# Patient Record
Sex: Male | Born: 1975 | Race: Black or African American | Hispanic: No | Marital: Single | Smoking: Current every day smoker
Health system: Southern US, Community
[De-identification: ages and names within clinical notes are randomized; demographics above are authoritative.]

## PROBLEM LIST (undated history)

## (undated) DIAGNOSIS — F1010 Alcohol abuse, uncomplicated: Secondary | ICD-10-CM

## (undated) DIAGNOSIS — E785 Hyperlipidemia, unspecified: Secondary | ICD-10-CM

## (undated) DIAGNOSIS — E669 Obesity, unspecified: Secondary | ICD-10-CM

## (undated) DIAGNOSIS — Z720 Tobacco use: Secondary | ICD-10-CM

## (undated) DIAGNOSIS — I428 Other cardiomyopathies: Secondary | ICD-10-CM

## (undated) DIAGNOSIS — G4730 Sleep apnea, unspecified: Secondary | ICD-10-CM

## (undated) DIAGNOSIS — I119 Hypertensive heart disease without heart failure: Secondary | ICD-10-CM

## (undated) DIAGNOSIS — E119 Type 2 diabetes mellitus without complications: Secondary | ICD-10-CM

## (undated) DIAGNOSIS — I5022 Chronic systolic (congestive) heart failure: Secondary | ICD-10-CM

## (undated) HISTORY — DX: Alcohol abuse, uncomplicated: F10.10

## (undated) HISTORY — DX: Hypertensive heart disease without heart failure: I11.9

## (undated) HISTORY — DX: Obesity, unspecified: E66.9

## (undated) HISTORY — DX: Hyperlipidemia, unspecified: E78.5

## (undated) HISTORY — DX: Type 2 diabetes mellitus without complications: E11.9

## (undated) HISTORY — DX: Other cardiomyopathies: I42.8

## (undated) HISTORY — DX: Chronic systolic (congestive) heart failure: I50.22

## (undated) HISTORY — DX: Sleep apnea, unspecified: G47.30

## (undated) HISTORY — DX: Tobacco use: Z72.0

---

## 2008-02-29 ENCOUNTER — Emergency Department: Payer: Self-pay | Admitting: Emergency Medicine

## 2009-08-20 ENCOUNTER — Emergency Department: Payer: Self-pay | Admitting: Emergency Medicine

## 2012-11-17 ENCOUNTER — Emergency Department: Payer: Self-pay | Admitting: Emergency Medicine

## 2012-11-17 LAB — BASIC METABOLIC PANEL
Anion Gap: 4 — ABNORMAL LOW (ref 7–16)
BUN: 10 mg/dL (ref 7–18)
Calcium, Total: 8.8 mg/dL (ref 8.5–10.1)
Co2: 29 mmol/L (ref 21–32)
Creatinine: 0.99 mg/dL (ref 0.60–1.30)
EGFR (Non-African Amer.): >60
Osmolality: 276 (ref 275–301)
Sodium: 137 mmol/L (ref 136–145)

## 2012-11-17 LAB — CBC
HCT: 44.7 % (ref 40.0–52.0)
HGB: 15.5 g/dL (ref 13.0–18.0)
MCH: 31.6 pg (ref 26.0–34.0)
MCHC: 34.7 g/dL (ref 32.0–36.0)
MCV: 91 fL (ref 80–100)
RDW: 13.8 % (ref 11.5–14.5)
WBC: 7.6 10*3/uL (ref 3.8–10.6)

## 2014-06-12 ENCOUNTER — Other Ambulatory Visit (HOSPITAL_COMMUNITY): Payer: Self-pay | Admitting: *Deleted

## 2014-06-12 DIAGNOSIS — I517 Cardiomegaly: Secondary | ICD-10-CM

## 2014-06-22 ENCOUNTER — Other Ambulatory Visit (INDEPENDENT_AMBULATORY_CARE_PROVIDER_SITE_OTHER): Payer: Medicaid Other

## 2014-06-22 ENCOUNTER — Other Ambulatory Visit: Payer: Self-pay

## 2014-06-22 ENCOUNTER — Encounter (INDEPENDENT_AMBULATORY_CARE_PROVIDER_SITE_OTHER): Payer: Self-pay

## 2014-06-22 DIAGNOSIS — I517 Cardiomegaly: Secondary | ICD-10-CM

## 2014-07-12 ENCOUNTER — Ambulatory Visit (INDEPENDENT_AMBULATORY_CARE_PROVIDER_SITE_OTHER): Payer: Medicaid Other | Admitting: Cardiovascular Disease

## 2014-07-12 ENCOUNTER — Encounter: Payer: Self-pay | Admitting: Cardiovascular Disease

## 2014-07-12 VITALS — BP 178/120 | HR 87 | Ht 70.0 in | Wt 267.0 lb

## 2014-07-12 DIAGNOSIS — I5022 Chronic systolic (congestive) heart failure: Secondary | ICD-10-CM

## 2014-07-12 DIAGNOSIS — E785 Hyperlipidemia, unspecified: Secondary | ICD-10-CM

## 2014-07-12 DIAGNOSIS — Z720 Tobacco use: Secondary | ICD-10-CM

## 2014-07-12 DIAGNOSIS — G4730 Sleep apnea, unspecified: Secondary | ICD-10-CM | POA: Insufficient documentation

## 2014-07-12 DIAGNOSIS — I10 Essential (primary) hypertension: Secondary | ICD-10-CM | POA: Insufficient documentation

## 2014-07-12 MED ORDER — CARVEDILOL 12.5 MG PO TABS: 12.5000 mg | ORAL_TABLET | Freq: Two times a day (BID) | ORAL | Status: DC

## 2014-07-12 NOTE — Assessment & Plan Note (Signed)
His lipid profile was reasonable on current dose of lovastatin.

## 2014-07-12 NOTE — Assessment & Plan Note (Signed)
His blood pressure is not controlled. I switched metoprolol to carvedilol for better control and given that he is diabetic. I discussed with him the importance of treating his sleep apnea which is likely contributing to uncontrolled hypertension.

## 2014-07-12 NOTE — Progress Notes (Signed)
Primary care physician: Dr. 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 He has known history of hypertension which has been uncontrolled, type 2 diabetes, obesity, sleep apnea not on CPAP and hyperlipidemia. He complains of exertional dyspnea without chest pain. He reports no orthopnea, PND or lower extremity edema. He underwent a nuclear stress test in October which showed no evidence of perfusion defects. However, ejection fraction was 40%. He had an echocardiogram done in our office which showed severely dilated left ventricle with an ejection fraction of 45-50%, mildly dilated left atrium and mildly dilated aortic root. He smokes one pack per day and drinks tequila and moonshine on weekends. He reports no family history of coronary artery disease or congestive heart failure. Recent labs were reviewed and overall showed unremarkable CBC and basic metabolic profile. Lipid profile showed a cholesterol of 177, HDL of 49, triglyceride of 148 and an LDL of 98.    No Known Allergies   No current outpatient prescriptions on file prior to visit.   No current facility-administered medications on file prior to visit.     Past Medical History  Diagnosis Date  . Essential hypertension   . Tobacco use   . Diabetes mellitus without complication   . Obesity   . Hyperlipidemia   . Sleep apnea      History reviewed. No pertinent past surgical history.   History reviewed. No pertinent family history.   History   Social History  . Marital Status: Single    Spouse Name: N/A    Number of Children: N/A  . Years of Education: N/A   Occupational History  . Not on file.   Social History Main Topics  . Smoking status: Current Every Day Smoker  . Smokeless tobacco: Not on file  . Alcohol Use: 0.0 oz/week    0 Not specified per week     Comment: SOCAILLY  . Drug  Use: No  . Sexual Activity: Not on file   Other Topics Concern  . Not on file   Social History Narrative  . No narrative on file     ROS A 10 point review of system was performed. It is negative other than that mentioned in the history of present illness.   PHYSICAL EXAM   BP 178/120 mmHg  Pulse 87  Ht 5\' 10"  (1.778 m)  Wt 267 lb (121.11 kg)  BMI 38.31 kg/m2 Constitutional: He is oriented to person, place, and time. He appears well-developed and well-nourished. No distress.  HENT: No nasal discharge.  Head: Normocephalic and atraumatic.  Eyes: Pupils are equal and round.  No discharge. Neck: Normal range of motion. Neck supple. No JVD present. No thyromegaly present.  Cardiovascular: Normal rate, regular rhythm, normal heart sounds. Exam reveals no gallop and no friction rub. No murmur heard.  Pulmonary/Chest: Effort normal and breath sounds normal. No stridor. No respiratory distress. He has no wheezes. He has no rales. He exhibits no tenderness.  Abdominal: Soft. Bowel sounds are normal. He exhibits no distension. There is no tenderness. There is no rebound and no guarding.  Musculoskeletal: Normal range of motion. He exhibits no edema and no tenderness.  Neurological: He is alert and oriented to person, place, and time. Coordination normal.  Skin: Skin is warm and dry. No rash noted. He is not diaphoretic. No erythema. No pallor.  Psychiatric: He has a normal mood and affect. His  behavior is normal. Judgment and thought content normal.     EKG: Normal sinus rhythm with left ventricular hypertrophy. Inferolateral ST and T-wave changes.   ASSESSMENT AND PLAN

## 2014-07-12 NOTE — Assessment & Plan Note (Signed)
The patient has chronic systolic heart failure due to nonischemic cardiomyopathy which is likely the result of hypertensive heart disease and uncontrolled hypertension. He is currently [place] Heart Association class II. There is no evidence of fluid overload. I had a prolonged discussion with him about the importance of lifestyle changes and weight loss. I also asked him to cut down or stop alcohol intake altogether. I discussed with him the importance of controlling his blood pressure.

## 2014-07-12 NOTE — Assessment & Plan Note (Signed)
I discussed with him the importance of smoking cessation. 

## 2014-07-12 NOTE — Patient Instructions (Signed)
Your physician has recommended you make the following change in your medication:  Stop Metoprolol  Start Coreg 12.5 mg twice daily    Your physician recommends that you schedule a follow-up appointment in:  3 months with Dr. Kirke Corin

## 2014-10-12 ENCOUNTER — Encounter: Payer: Self-pay | Admitting: Cardiovascular Disease

## 2014-10-12 ENCOUNTER — Ambulatory Visit (INDEPENDENT_AMBULATORY_CARE_PROVIDER_SITE_OTHER): Payer: Medicaid Other | Admitting: Cardiovascular Disease

## 2014-10-12 VITALS — BP 154/102 | HR 62 | Ht 70.0 in | Wt 268.5 lb

## 2014-10-12 DIAGNOSIS — I5022 Chronic systolic (congestive) heart failure: Secondary | ICD-10-CM

## 2014-10-12 DIAGNOSIS — E785 Hyperlipidemia, unspecified: Secondary | ICD-10-CM

## 2014-10-12 DIAGNOSIS — I10 Essential (primary) hypertension: Secondary | ICD-10-CM

## 2014-10-12 DIAGNOSIS — G4730 Sleep apnea, unspecified: Secondary | ICD-10-CM

## 2014-10-12 MED ORDER — AMLODIPINE BESYLATE 5 MG PO TABS: 5.0000 mg | ORAL_TABLET | Freq: Every day | ORAL | Status: DC

## 2014-10-12 NOTE — Assessment & Plan Note (Signed)
The patient is in [place] Heart Association class II. Ejection fraction was mildly reduced likely due to hypertensive heart disease. There is no evidence of fluid overload. Continue blood pressure control.

## 2014-10-12 NOTE — Progress Notes (Signed)
Primary care physician: Dr. 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 He has known history of hypertension which has been uncontrolled, type 2 diabetes, obesity, sleep apnea not on CPAP and hyperlipidemia. He complains of exertional dyspnea without chest pain. He reports no orthopnea, PND or lower extremity edema. He underwent a nuclear stress test in October which showed no evidence of perfusion defects. However, ejection fraction was 40%. He had an echocardiogram done in our office which showed severely dilated left ventricle with an ejection fraction of 45-50%, mildly dilated left atrium and mildly dilated aortic root. He smokes one pack per day and drinks tequila and moonshine on weekends. He reports no family history of coronary artery disease or congestive heart failure. During last visit, I switched metoprolol to carvedilol. Blood pressure improved since then. He is not using the CPAP.   No Known Allergies   Current Outpatient Prescriptions on File Prior to Visit  Medication Sig Dispense Refill  . carvedilol (COREG) 12.5 MG tablet Take 1 tablet (12.5 mg total) by mouth 2 (two) times daily. 60 tablet 6  . lisinopril-hydrochlorothiazide (PRINZIDE,ZESTORETIC) 20-25 MG per tablet Take 1 tablet by mouth daily.    Marland Kitchen lovastatin (MEVACOR) 10 MG tablet Take 10 mg by mouth at bedtime.    . metFORMIN (GLUCOPHAGE) 500 MG tablet Take by mouth 2 (two) times daily with a meal.     No current facility-administered medications on file prior to visit.     Past Medical History  Diagnosis Date  . Essential hypertension   . Tobacco use   . Diabetes mellitus without complication   . Obesity   . Hyperlipidemia   . Sleep apnea      History reviewed. No pertinent past surgical history.   History reviewed. No pertinent family history.   History   Social History   . Marital Status: Single    Spouse Name: N/A  . Number of Children: N/A  . Years of Education: N/A   Occupational History  . Not on file.   Social History Main Topics  . Smoking status: Current Every Day Smoker  . Smokeless tobacco: Not on file  . Alcohol Use: 0.0 oz/week    0 Standard drinks or equivalent per week     Comment: SOCAILLY  . Drug Use: No  . Sexual Activity: Not on file   Other Topics Concern  . Not on file   Social History Narrative     ROS A 10 point review of system was performed. It is negative other than that mentioned in the history of present illness.   PHYSICAL EXAM   BP 154/102 mmHg  Pulse 62  Ht 5\' 10"  (1.778 m)  Wt 268 lb 8 oz (121.791 kg)  BMI 38.53 kg/m2 Constitutional: He is oriented to person, place, and time. He appears well-developed and well-nourished. No distress.  HENT: No nasal discharge.  Head: Normocephalic and atraumatic.  Eyes: Pupils are equal and round.  No discharge. Neck: Normal range of motion. Neck supple. No JVD present. No thyromegaly present.  Cardiovascular: Normal rate, regular rhythm, normal heart sounds. Exam reveals no gallop and no friction rub. No murmur heard.  Pulmonary/Chest: Effort normal and breath sounds normal. No stridor. No respiratory distress. He has no wheezes. He has no rales. He exhibits no tenderness.  Abdominal: Soft. Bowel sounds are normal. He exhibits no distension. There is no  tenderness. There is no rebound and no guarding.  Musculoskeletal: Normal range of motion. He exhibits no edema and no tenderness.  Neurological: He is alert and oriented to person, place, and time. Coordination normal.  Skin: Skin is warm and dry. No rash noted. He is not diaphoretic. No erythema. No pallor.  Psychiatric: He has a normal mood and affect. His behavior is normal. Judgment and thought content normal.       ASSESSMENT AND PLAN

## 2014-10-12 NOTE — Assessment & Plan Note (Signed)
I strongly advised him to use the CPAP machine. Untreated sleep apnea is likely contributing to uncontrolled hypertension and heart failure.

## 2014-10-12 NOTE — Assessment & Plan Note (Signed)
Continue treatment with lovastatin with a target LDL of less than 100.   

## 2014-10-12 NOTE — Assessment & Plan Note (Signed)
Blood pressure improved significantly but still not optimal. Heart rate is somewhat on the low side. Thus, I'm hesitant to increase carvedilol. Thus, I added amlodipine 5 mg once daily. Continue other medications.

## 2014-10-12 NOTE — Patient Instructions (Signed)
Your physician has recommended you make the following change in your medication:  Start Amlodipine 5 mg once daily   Your physician recommends that you schedule a follow-up appointment in:  4 months with Dr. Kirke Corin

## 2015-02-11 ENCOUNTER — Encounter: Payer: Self-pay | Admitting: *Deleted

## 2015-02-11 ENCOUNTER — Ambulatory Visit: Payer: Medicaid Other | Admitting: Cardiovascular Disease

## 2015-02-13 ENCOUNTER — Encounter: Payer: Self-pay | Admitting: *Deleted

## 2015-02-18 NOTE — Telephone Encounter (Signed)
This encounter was created in error - please disregard.

## 2015-08-14 ENCOUNTER — Emergency Department: Payer: Medicaid Other

## 2015-08-14 ENCOUNTER — Other Ambulatory Visit: Payer: Self-pay

## 2015-08-14 ENCOUNTER — Emergency Department
Admission: EM | Admit: 2015-08-14 | Discharge: 2015-08-14 | Disposition: A | Discharge status: Home / Self Care | Payer: Medicaid Other | Attending: Emergency Medicine | Admitting: Emergency Medicine

## 2015-08-14 ENCOUNTER — Encounter: Payer: Self-pay | Admitting: *Deleted

## 2015-08-14 DIAGNOSIS — R0602 Shortness of breath: Secondary | ICD-10-CM | POA: Diagnosis present

## 2015-08-14 DIAGNOSIS — E119 Type 2 diabetes mellitus without complications: Secondary | ICD-10-CM | POA: Diagnosis not present

## 2015-08-14 DIAGNOSIS — Z79899 Other long term (current) drug therapy: Secondary | ICD-10-CM | POA: Diagnosis not present

## 2015-08-14 DIAGNOSIS — F17200 Nicotine dependence, unspecified, uncomplicated: Secondary | ICD-10-CM | POA: Diagnosis not present

## 2015-08-14 DIAGNOSIS — I10 Essential (primary) hypertension: Secondary | ICD-10-CM | POA: Insufficient documentation

## 2015-08-14 DIAGNOSIS — J209 Acute bronchitis, unspecified: Secondary | ICD-10-CM | POA: Diagnosis not present

## 2015-08-14 MED ORDER — METFORMIN HCL 500 MG PO TABS: 500.0000 mg | ORAL_TABLET | Freq: Two times a day (BID) | ORAL | Status: AC

## 2015-08-14 MED ORDER — LISINOPRIL-HYDROCHLOROTHIAZIDE 20-25 MG PO TABS: 1.0000 | ORAL_TABLET | Freq: Every day | ORAL | Status: DC

## 2015-08-14 MED ORDER — CARVEDILOL 12.5 MG PO TABS: 12.5000 mg | ORAL_TABLET | Freq: Two times a day (BID) | ORAL | Status: DC

## 2015-08-14 MED ORDER — PREDNISONE 20 MG PO TABS: 40.0000 mg | ORAL_TABLET | Freq: Every day | ORAL | Status: DC

## 2015-08-14 MED ORDER — ALBUTEROL SULFATE HFA 108 (90 BASE) MCG/ACT IN AERS: 2.0000 | INHALATION_SPRAY | RESPIRATORY_TRACT | Status: AC | PRN

## 2015-08-14 MED ORDER — AMLODIPINE BESYLATE 5 MG PO TABS: 5.0000 mg | ORAL_TABLET | Freq: Every day | ORAL | Status: DC

## 2015-08-14 NOTE — ED Notes (Signed)
Pt reports shortness of breath for the last 3 months, pt reports shortness of breath has increased the last few days

## 2015-08-14 NOTE — ED Provider Notes (Signed)
[facility] Emergency Department Provider Note  ____________________________________________  Time seen: 4:50 PM  I have reviewed the triage vital signs and the nursing notes.   HISTORY  Chief Complaint Shortness of Breath    HPI Jack Davidson is a 39 y.o. male who complains of shortness of breath for the past month. He has a history of sleep apnea and was prescribed a CPAP after a sleep study, but reports he has not been using it recently. The shortness of breath is episodic lasting 30-45 minutes at a time, no aggravating or alleviating factors, not exertional, not pleuritic. No chest pain at all. Has some mild productive cough. Denies underlying lung disease. No fevers, no recent viral illness, no vomiting diarrhea or abdominal pain. Reports that he is out of his heart medicine that he is given, thinks it might be carvedilol.     Past Medical History  Diagnosis Date  . Essential hypertension   . Tobacco use   . Diabetes mellitus without complication (HCC)   . Obesity   . Hyperlipidemia   . Sleep apnea      Patient Active Problem List   Diagnosis Date Noted  . Chronic systolic heart failure (HCC) 07/12/2014  . Essential hypertension   . Tobacco use   . Hyperlipidemia   . Sleep apnea      History reviewed. No pertinent past surgical history.   Current Outpatient Rx  Name  Route  Sig  Dispense  Refill  . albuterol (PROVENTIL HFA) 108 (90 BASE) MCG/ACT inhaler   Inhalation   Inhale 2 puffs into the lungs every 4 (four) hours as needed for wheezing or shortness of breath.   1 Inhaler   0   . amLODipine (NORVASC) 5 MG tablet   Oral   Take 1 tablet (5 mg total) by mouth daily.   30 tablet   6   . carvedilol (COREG) 12.5 MG tablet   Oral   Take 1 tablet (12.5 mg total) by mouth 2 (two) times daily.   60 tablet   6   . lisinopril-hydrochlorothiazide (PRINZIDE,ZESTORETIC) 20-25 MG per tablet   Oral   Take 1 tablet by mouth daily.          Marland Kitchen lovastatin (MEVACOR) 10 MG tablet   Oral   Take 10 mg by mouth at bedtime.         . metFORMIN (GLUCOPHAGE) 500 MG tablet   Oral   Take by mouth 2 (two) times daily with a meal.         . predniSONE (DELTASONE) 20 MG tablet   Oral   Take 2 tablets (40 mg total) by mouth daily.   8 tablet   0      Allergies Review of patient's allergies indicates no known allergies.   No family history on file.  Social History Social History  Substance Use Topics  . Smoking status: Current Every Day Smoker  . Smokeless tobacco: None  . Alcohol Use: 0.0 oz/week    0 Standard drinks or equivalent per week     Comment: SOCAILLY    Review of Systems  Constitutional:   No fever or chills. No weight changes Eyes:   No blurry vision or double vision.  ENT:   Positive sore throat. Cardiovascular:   No chest pain. Respiratory:   Positive dyspnea with cough. Gastrointestinal:   Negative for abdominal pain, vomiting and diarrhea.  No BRBPR or melena. Genitourinary:   Negative for dysuria, urinary retention,  bloody urine, or difficulty urinating. Musculoskeletal:   Negative for back pain. No joint swelling or pain. Skin:   Negative for rash. Neurological:   Negative for headaches, focal weakness or numbness. Psychiatric:  No anxiety or depression.   Endocrine:  No hot/cold intolerance, changes in energy, or sleep difficulty.  10-point ROS otherwise negative.  ____________________________________________   PHYSICAL EXAM:  VITAL SIGNS: ED Triage Vitals  Enc Vitals Group     BP 08/14/15 1620 156/82 mmHg     Pulse Rate 08/14/15 1620 91     Resp 08/14/15 1620 20     Temp 08/14/15 1620 98.1 F (36.7 C)     Temp Source 08/14/15 1620 Oral     SpO2 08/14/15 1620 95 %     Weight 08/14/15 1620 250 lb (113.399 kg)     Height 08/14/15 1620  (1.753 m)     Head Cir --      Peak Flow --      Pain Score --      Pain Loc --      Pain Edu? --      Excl. in GC? --      Vital signs reviewed, nursing assessments reviewed.   Constitutional:   Alert and oriented. Well appearing and in no distress. Eyes:   No scleral icterus. No conjunctival pallor. PERRL. EOMI ENT   Head:   Normocephalic and atraumatic.   Nose:   No congestion/rhinnorhea. No septal hematoma   Mouth/Throat:   MMM, no pharyngeal erythema. No peritonsillar mass. No uvula shift.   Neck:   No stridor. No SubQ emphysema. No meningismus. Hematological/Lymphatic/Immunilogical:   No cervical lymphadenopathy. Cardiovascular:   RRR. Normal and symmetric distal pulses are present in all extremities. No murmurs, rubs, or gallops. Respiratory:   Normal respiratory effort without tachypnea nor retractions. Breath sounds are clear and equal bilaterally. No wheezes/rales/rhonchi except for minimal crackles in the deep left base. Gastrointestinal:   Soft and nontender. No distention. There is no CVA tenderness.  No rebound, rigidity, or guarding. Genitourinary:   deferred Musculoskeletal:   Nontender with normal range of motion in all extremities. No joint effusions.  No lower extremity tenderness.  No edema. Neurologic:   Normal speech and language.  CN 2-10 normal. Motor grossly intact. No pronator drift.  Normal gait. No gross focal neurologic deficits are appreciated.  Skin:    Skin is warm, dry and intact. No rash noted.  No petechiae, purpura, or bullae. Psychiatric:   Mood and affect are normal. Speech and behavior are normal. Patient exhibits appropriate insight and judgment.  ____________________________________________    LABS (pertinent positives/negatives) (all labs ordered are listed, but only abnormal results are displayed) Labs Reviewed - No data to display ____________________________________________   EKG  Interpreted by me Sinus rhythm rate of 94, left axis, normal intervals. Normal QRS and ST segments. LVH with 3 pull abnormality in the lateral leads and 1  PVC.  ____________________________________________    RADIOLOGY  Chest x-ray shows some peribronchial cuffing consistent with acute bronchitis  ____________________________________________   PROCEDURES   ____________________________________________   INITIAL IMPRESSION / ASSESSMENT AND PLAN / ED COURSE  Pertinent labs & imaging results that were available during my care of the patient were reviewed by me and considered in my medical decision making (see chart for details).  Patient presents with chronic episodic shortness of breath. Has not seen his doctor for this yet. Most likely related to CPAP noncompliance. Chest x-ray also suggests bronchitis,  also will start a trial of steroids and albuterol. Also refill his medications to ensure that he is managing his hypertension CAD and diabetes. Social is well-appearing no acute distress with reassuring vital signs and exam.     ____________________________________________   FINAL CLINICAL IMPRESSION(S) / ED DIAGNOSES  Final diagnoses:  Acute bronchitis, unspecified organism      Sharman Cheek, MD 08/14/15 1719

## 2015-08-14 NOTE — ED Notes (Signed)
C/o some sob cough past month

## 2015-08-14 NOTE — Discharge Instructions (Signed)

## 2015-12-09 ENCOUNTER — Other Ambulatory Visit: Payer: Self-pay | Admitting: Internal Medicine

## 2015-12-09 DIAGNOSIS — R0602 Shortness of breath: Secondary | ICD-10-CM

## 2015-12-12 ENCOUNTER — Ambulatory Visit: Payer: Medicaid Other | Attending: Internal Medicine

## 2015-12-26 ENCOUNTER — Ambulatory Visit: Payer: Medicaid Other | Attending: Internal Medicine

## 2016-01-14 ENCOUNTER — Inpatient Hospital Stay
Admission: EM | Admit: 2016-01-14 | Discharge: 2016-01-15 | DRG: 293 | Disposition: A | Discharge status: Home / Self Care | Payer: Medicaid Other | Attending: Internal Medicine | Admitting: Internal Medicine

## 2016-01-14 ENCOUNTER — Encounter: Payer: Self-pay | Admitting: Emergency Medicine

## 2016-01-14 ENCOUNTER — Emergency Department: Payer: Medicaid Other

## 2016-01-14 ENCOUNTER — Inpatient Hospital Stay
Admit: 2016-01-14 | Discharge: 2016-01-14 | Disposition: A | Discharge status: Home / Self Care | Payer: Medicaid Other | Attending: Internal Medicine | Admitting: Internal Medicine

## 2016-01-14 DIAGNOSIS — F17290 Nicotine dependence, other tobacco product, uncomplicated: Secondary | ICD-10-CM | POA: Diagnosis present

## 2016-01-14 DIAGNOSIS — E876 Hypokalemia: Secondary | ICD-10-CM | POA: Diagnosis present

## 2016-01-14 DIAGNOSIS — Z7952 Long term (current) use of systemic steroids: Secondary | ICD-10-CM

## 2016-01-14 DIAGNOSIS — E785 Hyperlipidemia, unspecified: Secondary | ICD-10-CM | POA: Diagnosis present

## 2016-01-14 DIAGNOSIS — I5023 Acute on chronic systolic (congestive) heart failure: Secondary | ICD-10-CM | POA: Diagnosis present

## 2016-01-14 DIAGNOSIS — Z6835 Body mass index (BMI) 35.0-35.9, adult: Secondary | ICD-10-CM

## 2016-01-14 DIAGNOSIS — Z79899 Other long term (current) drug therapy: Secondary | ICD-10-CM | POA: Diagnosis not present

## 2016-01-14 DIAGNOSIS — R0602 Shortness of breath: Secondary | ICD-10-CM | POA: Diagnosis present

## 2016-01-14 DIAGNOSIS — I5021 Acute systolic (congestive) heart failure: Secondary | ICD-10-CM

## 2016-01-14 DIAGNOSIS — E669 Obesity, unspecified: Secondary | ICD-10-CM | POA: Diagnosis present

## 2016-01-14 DIAGNOSIS — Z833 Family history of diabetes mellitus: Secondary | ICD-10-CM | POA: Diagnosis not present

## 2016-01-14 DIAGNOSIS — E119 Type 2 diabetes mellitus without complications: Secondary | ICD-10-CM | POA: Diagnosis present

## 2016-01-14 DIAGNOSIS — I110 Hypertensive heart disease with heart failure: Secondary | ICD-10-CM | POA: Diagnosis present

## 2016-01-14 DIAGNOSIS — I509 Heart failure, unspecified: Secondary | ICD-10-CM

## 2016-01-14 DIAGNOSIS — Z7984 Long term (current) use of oral hypoglycemic drugs: Secondary | ICD-10-CM

## 2016-01-14 LAB — CBC
HEMATOCRIT: 42.1 % (ref 40.0–52.0)
Hemoglobin: 14.3 g/dL (ref 13.0–18.0)
MCH: 32.1 pg (ref 26.0–34.0)
MCHC: 34.1 g/dL (ref 32.0–36.0)
MCV: 94.2 fL (ref 80.0–100.0)
PLATELETS: 182 10*3/uL (ref 150–440)
RBC: 4.47 MIL/uL (ref 4.40–5.90)
RDW: 15.3 % — AB (ref 11.5–14.5)
WBC: 7.2 10*3/uL (ref 3.8–10.6)

## 2016-01-14 LAB — GLUCOSE, CAPILLARY
GLUCOSE-CAPILLARY: 234 mg/dL — AB (ref 65–99)
Glucose-Capillary: 234 mg/dL — ABNORMAL HIGH (ref 65–99)

## 2016-01-14 LAB — COMPREHENSIVE METABOLIC PANEL
ALT: 255 U/L — ABNORMAL HIGH (ref 17–63)
ANION GAP: 8 (ref 5–15)
AST: 145 U/L — ABNORMAL HIGH (ref 15–41)
Albumin: 4.4 g/dL (ref 3.5–5.0)
Alkaline Phosphatase: 57 U/L (ref 38–126)
BUN: 12 mg/dL (ref 6–20)
CHLORIDE: 105 mmol/L (ref 101–111)
CO2: 26 mmol/L (ref 22–32)
Calcium: 9.5 mg/dL (ref 8.9–10.3)
Creatinine, Ser: 0.97 mg/dL (ref 0.61–1.24)
GFR calc Af Amer: >60 mL/min (ref >60)
GFR calc non Af Amer: >60 mL/min (ref >60)
Glucose, Bld: 132 mg/dL — ABNORMAL HIGH (ref 65–99)
POTASSIUM: 4 mmol/L (ref 3.5–5.1)
Sodium: 139 mmol/L (ref 135–145)
Total Bilirubin: 0.7 mg/dL (ref 0.3–1.2)
Total Protein: 7.3 g/dL (ref 6.5–8.1)

## 2016-01-14 LAB — BRAIN NATRIURETIC PEPTIDE: B NATRIURETIC PEPTIDE 5: 428 pg/mL — AB (ref 0.0–100.0)

## 2016-01-14 LAB — TROPONIN I

## 2016-01-14 MED ORDER — HEPARIN SODIUM (PORCINE) 5000 UNIT/ML IJ SOLN
5000.0000 [IU] | Freq: Three times a day (TID) | INTRAMUSCULAR | Status: DC
Start: 2016-01-14 — End: 2016-01-15
  Administered 2016-01-14 – 2016-01-15 (×2): 5000 [IU] via SUBCUTANEOUS
  Filled 2016-01-14 (×2): qty 1

## 2016-01-14 MED ORDER — ACETAMINOPHEN 325 MG PO TABS
ORAL_TABLET | ORAL | Status: AC
  Administered 2016-01-14: 650 mg
  Filled 2016-01-14: qty 2

## 2016-01-14 MED ORDER — AMLODIPINE BESYLATE 5 MG PO TABS
5.0000 mg | ORAL_TABLET | Freq: Every day | ORAL | Status: DC
  Administered 2016-01-14 – 2016-01-15 (×2): 5 mg via ORAL
  Filled 2016-01-14 (×3): qty 1

## 2016-01-14 MED ORDER — CARVEDILOL 12.5 MG PO TABS
12.5000 mg | ORAL_TABLET | Freq: Two times a day (BID) | ORAL | Status: DC
  Administered 2016-01-14 – 2016-01-15 (×2): 12.5 mg via ORAL
  Filled 2016-01-14 (×2): qty 1

## 2016-01-14 MED ORDER — METHYLPREDNISOLONE SODIUM SUCC 125 MG IJ SOLR
125.0000 mg | Freq: Once | INTRAMUSCULAR | Status: AC
  Administered 2016-01-14: 125 mg via INTRAVENOUS
  Filled 2016-01-14: qty 2

## 2016-01-14 MED ORDER — SODIUM CHLORIDE 0.9% FLUSH
3.0000 mL | Freq: Two times a day (BID) | INTRAVENOUS | Status: DC
  Administered 2016-01-14 – 2016-01-15 (×2): 3 mL via INTRAVENOUS

## 2016-01-14 MED ORDER — METFORMIN HCL 500 MG PO TABS
500.0000 mg | ORAL_TABLET | Freq: Two times a day (BID) | ORAL | Status: DC
  Administered 2016-01-15: 500 mg via ORAL
  Filled 2016-01-14: qty 1

## 2016-01-14 MED ORDER — IBUPROFEN 400 MG PO TABS
400.0000 mg | ORAL_TABLET | ORAL | Status: AC
  Administered 2016-01-14: 400 mg via ORAL
  Filled 2016-01-14: qty 1

## 2016-01-14 MED ORDER — ALBUTEROL SULFATE (2.5 MG/3ML) 0.083% IN NEBU
2.5000 mg | INHALATION_SOLUTION | Freq: Once | RESPIRATORY_TRACT | Status: AC
  Administered 2016-01-14: 2.5 mg via RESPIRATORY_TRACT
  Filled 2016-01-14: qty 3

## 2016-01-14 MED ORDER — FUROSEMIDE 10 MG/ML IJ SOLN
20.0000 mg | Freq: Two times a day (BID) | INTRAMUSCULAR | Status: DC
  Administered 2016-01-14 – 2016-01-15 (×2): 20 mg via INTRAVENOUS
  Filled 2016-01-14 (×2): qty 2

## 2016-01-14 MED ORDER — ACETAMINOPHEN 325 MG PO TABS: 650.0000 mg | ORAL_TABLET | Freq: Once | ORAL | Status: DC

## 2016-01-14 MED ORDER — PRAVASTATIN SODIUM 10 MG PO TABS
10.0000 mg | ORAL_TABLET | Freq: Every day | ORAL | Status: DC
  Administered 2016-01-14: 10 mg via ORAL
  Filled 2016-01-14: qty 1

## 2016-01-14 MED ORDER — INSULIN ASPART 100 UNIT/ML ~~LOC~~ SOLN
0.0000 [IU] | Freq: Three times a day (TID) | SUBCUTANEOUS | Status: DC
  Administered 2016-01-15: 2 [IU] via SUBCUTANEOUS
  Filled 2016-01-14: qty 2

## 2016-01-14 MED ORDER — IPRATROPIUM-ALBUTEROL 0.5-2.5 (3) MG/3ML IN SOLN
3.0000 mL | Freq: Once | RESPIRATORY_TRACT | Status: AC
  Administered 2016-01-14: 3 mL via RESPIRATORY_TRACT
  Filled 2016-01-14: qty 3

## 2016-01-14 MED ORDER — ALBUTEROL SULFATE HFA 108 (90 BASE) MCG/ACT IN AERS: 2.0000 | INHALATION_SPRAY | RESPIRATORY_TRACT | Status: DC | PRN

## 2016-01-14 MED ORDER — NICOTINE 21 MG/24HR TD PT24
21.0000 mg | MEDICATED_PATCH | Freq: Every day | TRANSDERMAL | Status: DC
  Administered 2016-01-14 – 2016-01-15 (×2): 21 mg via TRANSDERMAL
  Filled 2016-01-14 (×2): qty 1

## 2016-01-14 MED ORDER — NITROGLYCERIN 2 % TD OINT
1.0000 [in_us] | TOPICAL_OINTMENT | Freq: Four times a day (QID) | TRANSDERMAL | Status: DC
  Administered 2016-01-14: 1 [in_us] via TOPICAL
  Filled 2016-01-14: qty 1

## 2016-01-14 MED ORDER — FUROSEMIDE 10 MG/ML IJ SOLN
40.0000 mg | Freq: Once | INTRAMUSCULAR | Status: AC
  Administered 2016-01-14: 40 mg via INTRAVENOUS
  Filled 2016-01-14: qty 4

## 2016-01-14 MED ORDER — ALBUTEROL SULFATE (2.5 MG/3ML) 0.083% IN NEBU: 2.5000 mg | INHALATION_SOLUTION | RESPIRATORY_TRACT | Status: DC | PRN

## 2016-01-14 NOTE — ED Notes (Signed)
Patient presents to the ED stating, "I need air, I can't catch my breath."  Patient prefers standing to sitting.  Patient appears pale and skin is clammy.  Patient states he generally has shortness of breath in the evening but patient states last night it was so bad he couldn't ever get to sleep.  Patient reports taking several medications for hypertension and diabetes.  Patient denies known history of COPD, asthma, or CHF.  Patient denies chest pain at this time.  Patient is obviously tachypnic.

## 2016-01-14 NOTE — ED Notes (Signed)
Pt placed on [place] after nebulizer treatment and encouraged to breathe through nose. Pt calming down at this time and on the stretcher.

## 2016-01-14 NOTE — Progress Notes (Signed)
A  &O .Reported HA and received meds. Takes meds ok. NSR. Room air. Pt has no further concerns at this time.

## 2016-01-14 NOTE — ED Notes (Signed)
Patient appears to be uncomfortable while on nasal cannula, unable to sit still even sitting straight up, patient prefers to stand and feels he cannot breathe well.  Patient placed on non-re breather at this time.  MD aware.

## 2016-01-14 NOTE — ED Provider Notes (Signed)
[facility] Emergency Department Provider Note  ____________________________________________    I have reviewed the triage vital signs and the nursing notes.   HISTORY  Chief Complaint Shortness of Breath    HPI Jack Davidson is a 40 y.o. male who presents with shortness of breath. He reports worsening shortness of breath over the last day. Today it is significantly worse and he feels like he cannot catch his breath. He does smoke a pack of cigars a day but denies a history of COPD, no history of CHF. He denies chest pain. No fevers or chills. No recent travel. No calf pain or swelling, but he does report lower extremity swelling intermittently over the last week.Does have a history of high blood pressure that is poorly controlled. He does have a history of diabetes. No pleurisy     Past Medical History  Diagnosis Date  . Essential hypertension   . Tobacco use   . Diabetes mellitus without complication (HCC)   . Obesity   . Hyperlipidemia   . Sleep apnea     Patient Active Problem List   Diagnosis Date Noted  . Chronic systolic heart failure (HCC) 07/12/2014  . Essential hypertension   . Tobacco use   . Hyperlipidemia   . Sleep apnea     History reviewed. No pertinent past surgical history.  Current Outpatient Rx  Name  Route  Sig  Dispense  Refill  . amLODipine (NORVASC) 5 MG tablet   Oral   Take 1 tablet (5 mg total) by mouth daily. Patient taking differently: Take 10 mg by mouth daily.    30 tablet   1   . carvedilol (COREG) 12.5 MG tablet   Oral   Take 1 tablet (12.5 mg total) by mouth 2 (two) times daily. Patient taking differently: Take 25 mg by mouth daily.    60 tablet   1   . metFORMIN (GLUCOPHAGE) 500 MG tablet   Oral   Take 1 tablet (500 mg total) by mouth 2 (two) times daily with a meal.   60 tablet   1   . albuterol (PROVENTIL HFA) 108 (90 BASE) MCG/ACT inhaler   Inhalation   Inhale 2 puffs into the lungs every 4  (four) hours as needed for wheezing or shortness of breath.   1 Inhaler   0   . lovastatin (MEVACOR) 10 MG tablet   Oral   Take 10 mg by mouth at bedtime.         . predniSONE (DELTASONE) 20 MG tablet   Oral   Take 2 tablets (40 mg total) by mouth daily.   8 tablet   0     Allergies Review of patient's allergies indicates no known allergies.  No family history on file.  Social History Social History  Substance Use Topics  . Smoking status: Current Every Day Smoker  . Smokeless tobacco: None  . Alcohol Use: 0.0 oz/week    0 Standard drinks or equivalent per week     Comment: SOCAILLY    Review of Systems  Constitutional: Negative for fever. Eyes: Negative for redness ENT: Negative for sore throat Cardiovascular: Negative for chest pain Respiratory: As above Gastrointestinal: Negative for abdominal pain Genitourinary: Negative for dysuria. Musculoskeletal: Negative for back pain. Skin: Negative for rash. Neurological: Negative for focal weakness Psychiatric: Mild anxiety    ____________________________________________   PHYSICAL EXAM:  VITAL SIGNS: ED Triage Vitals  Enc Vitals Group     BP 01/14/16 1100 164/108  mmHg     Pulse Rate 01/14/16 1100 86     Resp 01/14/16 1100 32     Temp 01/14/16 1100 98.3 F (36.8 C)     Temp Source 01/14/16 1100 Oral     SpO2 01/14/16 1100 86 %     Weight 01/14/16 1100 245 lb (111.131 kg)     Height 01/14/16 1100  (1.753 m)     Head Cir --      Peak Flow --      Pain Score --      Pain Loc --      Pain Edu? --      Excl. in GC? --     Constitutional: Alert and oriented. Noticeably short of breath Eyes: Conjunctivae are normal. No erythema or injection ENT   Head: Normocephalic and atraumatic.   Mouth/Throat: Mucous membranes are moist. Cardiovascular: Normal rate, regular rhythm. Normal and symmetric distal pulses are present in the upper extremities. No murmurs or rubs  Respiratory: Increased  respiratory effort with tachypnea. Scattered wheezes and bibasilar rales Gastrointestinal: Soft and non-tender in all quadrants. No distention. There is no CVA tenderness. Genitourinary: deferred Musculoskeletal: Nontender with normal range of motion in all extremities.  Neurologic:  Normal speech and language. No gross focal neurologic deficits are appreciated. Skin:  Skin is warm, dry and intact. No rash noted. Psychiatric: Mood and affect are normal. Patient exhibits appropriate insight and judgment.  ____________________________________________    LABS (pertinent positives/negatives)  Labs Reviewed  CBC - Abnormal; Notable for the following:    RDW 15.3 (*)    All other components within normal limits  COMPREHENSIVE METABOLIC PANEL - Abnormal; Notable for the following:    Glucose, Bld 132 (*)    AST 145 (*)    ALT 255 (*)    All other components within normal limits  BRAIN NATRIURETIC PEPTIDE - Abnormal; Notable for the following:    B Natriuretic Peptide 428.0 (*)    All other components within normal limits  TROPONIN I    ____________________________________________   EKG  ED ECG REPORT I, Jene Every, the attending physician, personally viewed and interpreted this ECG.  Date: 01/14/2016 EKG Time: 11:05 AM Rate: 88 Rhythm: normal sinus rhythm QRS Axis: normal Intervals: Prolonged QT ST/T Wave abnormalities: normal  e   ____________________________________________    RADIOLOGY  Chest x-ray shows pulmonary vascular congestion   ____________________________________________   PROCEDURES  Procedure(s) performed: none  Critical Care performed: none  ____________________________________________   INITIAL IMPRESSION / ASSESSMENT AND PLAN / ED COURSE  Pertinent labs & imaging results that were available during my care of the patient were reviewed by me and considered in my medical decision making (see chart for details).  Patient presents with  shortness of breath, he is hypoxic. Differential includes COPD, less likely CHF, we will treat with 2 nebs and steroids provided oxygen and reevaluate after labs and x-rays are returned.  Chest x-ray is more consistent with pulmonary vascular congestion, suggest CHF. Lasix 40 mg ordered and Nitropaste applied  Patient will require admission for further management  ____________________________________________   FINAL CLINICAL IMPRESSION(S) / ED DIAGNOSES  Final diagnoses:  Acute congestive heart failure, unspecified congestive heart failure type (HCC)          Jene Every, MD 01/14/16 1326

## 2016-01-14 NOTE — Progress Notes (Signed)
Pt instructed to not take outside medications. Pt was on the phone stating "bring me my BC powder". Pt was told hospital could provide him with all his meds.

## 2016-01-14 NOTE — ED Notes (Signed)
Patient placed on 4L [place] at this time.  Oxygen saturation is 90%.

## 2016-01-14 NOTE — H&P (Signed)
[facility] - [place] at [facility]   PATIENT NAME: Jack Davidson    MR#:  [id number]  DATE OF BIRTH:  April 07, 1976  DATE OF ADMISSION:  01/14/2016  PRIMARY CARE PHYSICIAN: Sherrie Mustache, MD   REQUESTING/REFERRING PHYSICIAN: Kinner  CHIEF COMPLAINT:   Chief Complaint  Patient presents with  . Shortness of Breath    HISTORY OF PRESENT ILLNESS: Jack Davidson  is a 40 y.o. male with a known history of Essential hypertension, active smoking, diabetes, obesity, hyperlipidemia, CHF- ejection fraction 40% as per the previous record, for last few weeks is progressively getting more and more short of breath. He says his shortness of breath comes while he is doing some work and also while he tried to sleep flat at night time he was little bit better sitting up in the bed. He also noted some swelling on his both ankles, and progressively overall condition getting worse for last 3-4 days so he decided to come to emergency room today. On chest x-ray was noted to have pulmonary edema and so given to hospitalist team for admission.  PAST MEDICAL HISTORY:   Past Medical History  Diagnosis Date  . Essential hypertension   . Tobacco use   . Diabetes mellitus without complication (HCC)   . Obesity   . Hyperlipidemia   . Sleep apnea   . CHF (congestive heart failure) (HCC)     PAST SURGICAL HISTORY: History reviewed. No pertinent past surgical history.  SOCIAL HISTORY:  Social History  Substance Use Topics  . Smoking status: Current Every Day Smoker -- 0.50 packs/day  . Smokeless tobacco: Not on file  . Alcohol Use: 0.0 oz/week    0 Standard drinks or equivalent per week     Comment: SOCAILLY    FAMILY HISTORY:  Family History  Problem Relation Age of Onset  . Diabetes Mother     DRUG ALLERGIES: No Known Allergies  REVIEW OF SYSTEMS:   CONSTITUTIONAL: No fever, fatigue or weakness.  EYES: No blurred or double vision.  EARS, NOSE, AND THROAT: No tinnitus or ear  pain.  RESPIRATORY: No cough, Positive for shortness of breath, no wheezing or hemoptysis.  CARDIOVASCULAR: No chest pain, orthopnea, edema.  GASTROINTESTINAL: No nausea, vomiting, diarrhea or abdominal pain.  GENITOURINARY: No dysuria,Hematuria.  ENDOCRINE: No polyuria, nocturia,  HEMATOLOGY: No anemia, easy bruising or bleeding SKIN: No rash or lesion. MUSCULOSKELETAL: No joint pain or arthritis.   NEUROLOGIC: No tingling, numbness, weakness.  PSYCHIATRY: No anxiety or depression.   MEDICATIONS AT HOME:  Prior to Admission medications   Medication Sig Start Date End Date Taking? Authorizing Provider  amLODipine (NORVASC) 5 MG tablet Take 1 tablet (5 mg total) by mouth daily. Patient taking differently: Take 10 mg by mouth daily.  08/14/15  Yes Sharman Cheek, MD  carvedilol (COREG) 12.5 MG tablet Take 1 tablet (12.5 mg total) by mouth 2 (two) times daily. Patient taking differently: Take 25 mg by mouth daily.  08/14/15  Yes Sharman Cheek, MD  metFORMIN (GLUCOPHAGE) 500 MG tablet Take 1 tablet (500 mg total) by mouth 2 (two) times daily with a meal. 08/14/15  Yes Sharman Cheek, MD  albuterol (PROVENTIL HFA) 108 (90 BASE) MCG/ACT inhaler Inhale 2 puffs into the lungs every 4 (four) hours as needed for wheezing or shortness of breath. 08/14/15   Sharman Cheek, MD  lovastatin (MEVACOR) 10 MG tablet Take 10 mg by mouth at bedtime.    Historical Provider, MD  predniSONE (DELTASONE) 20 MG tablet  Take 2 tablets (40 mg total) by mouth daily. 08/14/15   Sharman Cheek, MD      PHYSICAL EXAMINATION:   VITAL SIGNS: Blood pressure 148/128, pulse 83, temperature 98.3 F (36.8 C), temperature source Oral, resp. rate 19, height 5\' 9"  (1.753 m), weight 111.131 kg (245 lb), SpO2 98 %.  GENERAL:  40 y.o.-year-old patient lying in the bed with no acute distress.  EYES: Pupils equal, round, reactive to light and accommodation. No scleral icterus. Extraocular muscles intact.  HEENT: Head  atraumatic, normocephalic. Oropharynx and nasopharynx clear.  NECK:  Supple, no jugular venous distention. No thyroid enlargement, no tenderness.  LUNGS: Normal breath sounds bilaterally, no wheezing,Some crepitation. No use of accessory muscles of respiration.  CARDIOVASCULAR: S1, S2 normal. No murmurs, rubs, or gallops.  ABDOMEN: Soft, nontender, nondistended. Bowel sounds present. No organomegaly or mass.  EXTREMITIES: Mild bilateral pedal edema, no cyanosis, or clubbing.  NEUROLOGIC: Cranial nerves II through XII are intact. Muscle strength 5/5 in all extremities. Sensation intact. Gait not checked.  PSYCHIATRIC: The patient is alert and oriented x 3.  SKIN: No obvious rash, lesion, or ulcer.   LABORATORY PANEL:   CBC  Recent Labs Lab 01/14/16 1139  WBC 7.2  HGB 14.3  HCT 42.1  PLT 182  MCV 94.2  MCH 32.1  MCHC 34.1  RDW 15.3*   ------------------------------------------------------------------------------------------------------------------  Chemistries   Recent Labs Lab 01/14/16 1139  NA 139  K 4.0  CL 105  CO2 26  GLUCOSE 132*  BUN 12  CREATININE 0.97  CALCIUM 9.5  AST 145*  ALT 255*  ALKPHOS 57  BILITOT 0.7   ------------------------------------------------------------------------------------------------------------------ estimated creatinine clearance is 123.2 mL/min (by C-G formula based on Cr of 0.97). ------------------------------------------------------------------------------------------------------------------ No results for input(s): TSH, T4TOTAL, T3FREE, THYROIDAB in the last 72 hours.  Invalid input(s): FREET3   Coagulation profile No results for input(s): INR, PROTIME in the last 168 hours. ------------------------------------------------------------------------------------------------------------------- No results for input(s): DDIMER in the last 72  hours. -------------------------------------------------------------------------------------------------------------------  Cardiac Enzymes  Recent Labs Lab 01/14/16 1139  TROPONINI <0.03   ------------------------------------------------------------------------------------------------------------------ Invalid input(s): POCBNP  ---------------------------------------------------------------------------------------------------------------  Urinalysis No results found for: COLORURINE, APPEARANCEUR, LABSPEC, PHURINE, GLUCOSEU, HGBUR, BILIRUBINUR, KETONESUR, PROTEINUR, UROBILINOGEN, NITRITE, LEUKOCYTESUR   RADIOLOGY: Dg Chest Portable 1 View  01/14/2016  CLINICAL DATA:  Patient states he needs air, unable to catch his breath, shortness of breath in the evenings generally but increased last night, unable to sleep, hypertension, diabetes mellitus, smoker EXAM: PORTABLE CHEST 1 VIEW COMPARISON:  Portable exam 1121 hours compared to 08/14/2015 FINDINGS: Enlargement of cardiac silhouette with pulmonary vascular congestion. Mild elongation of thoracic aorta. Slight chronic accentuation of perihilar markings stable. Mild RIGHT basilar atelectasis. Remaining lungs clear. No definite pleural effusion or pneumothorax. IMPRESSION: Enlargement of cardiac silhouette with pulmonary vascular congestion. Chronic accentuation of pulmonary markings with mild RIGHT basilar atelectasis but no definite acute infiltrate. Electronically Signed   By: Ulyses Southward M.D.   On: 01/14/2016 11:44    EKG: Orders placed or performed during the hospital encounter of 01/14/16  . EKG 12-Lead  . EKG 12-Lead   Normal sinus rhythm with left ventricular hypertrophy.  IMPRESSION AND PLAN:  * Acute on chronic systolic congestive heart failure   IV Lasix, monitor on telemetry, follow serial troponin, daily weight, intake and output measurement.   Get echocardiogram.  * Hypertension   Amlodipine, carvedilol  *  Hyperlipidemia   Continue pravastatin.  * Diabetes   Continue metformin.  * Smoking   Counseled  to quit smoking for 4 minutes and offered nicotine patch.   All the records are reviewed and case discussed with ED provider. Management plans discussed with the patient, family and they are in agreement.  CODE STATUS: Full Code Status History    This patient does not have a recorded code status. Please follow your organizational policy for patients in this situation.       TOTAL TIME TAKING CARE OF THIS PATIENT: 50 minutes.    Altamese Dilling M.D on 01/14/2016   Between 7am to 6pm - Pager - [phone]  After 6pm go to [email or web address] - password [email or web address]  [facility]  Office  [phone]  CC: Primary care physician; Sherrie Mustache, MD   Note: This dictation was prepared with Dragon dictation along with smaller phrase technology. Any transcriptional errors that result from this process are unintentional.

## 2016-01-15 ENCOUNTER — Inpatient Hospital Stay: Payer: Medicaid Other

## 2016-01-15 LAB — CBC
HCT: 40.1 % (ref 40.0–52.0)
Hemoglobin: 13.7 g/dL (ref 13.0–18.0)
MCH: 32 pg (ref 26.0–34.0)
MCHC: 34.1 g/dL (ref 32.0–36.0)
MCV: 93.6 fL (ref 80.0–100.0)
PLATELETS: 178 10*3/uL (ref 150–440)
RBC: 4.28 MIL/uL — ABNORMAL LOW (ref 4.40–5.90)
RDW: 14.9 % — AB (ref 11.5–14.5)
WBC: 9 10*3/uL (ref 3.8–10.6)

## 2016-01-15 LAB — BASIC METABOLIC PANEL
Anion gap: 8 (ref 5–15)
BUN: 18 mg/dL (ref 6–20)
CALCIUM: 9.2 mg/dL (ref 8.9–10.3)
CO2: 25 mmol/L (ref 22–32)
CREATININE: 1.08 mg/dL (ref 0.61–1.24)
Chloride: 104 mmol/L (ref 101–111)
GFR calc Af Amer: >60 mL/min (ref >60)
Glucose, Bld: 161 mg/dL — ABNORMAL HIGH (ref 65–99)
Potassium: 3.3 mmol/L — ABNORMAL LOW (ref 3.5–5.1)
SODIUM: 137 mmol/L (ref 135–145)

## 2016-01-15 LAB — MAGNESIUM: Magnesium: 2 mg/dL (ref 1.7–2.4)

## 2016-01-15 LAB — GLUCOSE, CAPILLARY: Glucose-Capillary: 170 mg/dL — ABNORMAL HIGH (ref 65–99)

## 2016-01-15 LAB — ECHOCARDIOGRAM COMPLETE
Height: 69 in
WEIGHTICAEL: 3824 [oz_av]

## 2016-01-15 LAB — TROPONIN I

## 2016-01-15 MED ORDER — POTASSIUM CHLORIDE CRYS ER 20 MEQ PO TBCR
40.0000 meq | EXTENDED_RELEASE_TABLET | Freq: Once | ORAL | Status: AC
  Administered 2016-01-15: 40 meq via ORAL
  Filled 2016-01-15: qty 2

## 2016-01-15 MED ORDER — FUROSEMIDE 40 MG PO TABS: 40.0000 mg | ORAL_TABLET | Freq: Every day | ORAL | Status: DC

## 2016-01-15 MED ORDER — CARVEDILOL 25 MG PO TABS: 25.0000 mg | ORAL_TABLET | Freq: Two times a day (BID) | ORAL | Status: DC

## 2016-01-15 MED ORDER — CARVEDILOL 12.5 MG PO TABS
12.5000 mg | ORAL_TABLET | Freq: Once | ORAL | Status: AC
  Administered 2016-01-15: 12.5 mg via ORAL
  Filled 2016-01-15: qty 1

## 2016-01-15 MED ORDER — FUROSEMIDE 40 MG PO TABS
40.0000 mg | ORAL_TABLET | Freq: Every day | ORAL | Status: DC
  Administered 2016-01-15: 40 mg via ORAL
  Filled 2016-01-15: qty 1

## 2016-01-15 NOTE — Discharge Summary (Signed)
[facility] -  [place] [facility]   PATIENT NAME: Jack Davidson    MR#:  [id number]  DATE OF BIRTH:  04-28-1976  DATE OF ADMISSION:  01/14/2016 ADMITTING PHYSICIAN: Altamese Dilling, MD  DATE OF DISCHARGE: 01/15/2016 11:04 AM  PRIMARY CARE PHYSICIAN: Sherrie Mustache, MD    ADMISSION DIAGNOSIS:  Acute on chronic systolic CHF (congestive heart failure) (HCC) [I50.23] Acute congestive heart failure, unspecified congestive heart failure type (HCC) [I50.9]   DISCHARGE DIAGNOSIS:  Acute on chronic systolic congestive heart failure, EF: 45% - 50% Accelerated Hypertension SECONDARY DIAGNOSIS:   Past Medical History  Diagnosis Date  . Essential hypertension   . Tobacco use   . Diabetes mellitus without complication (HCC)   . Obesity   . Hyperlipidemia   . Sleep apnea   . CHF (congestive heart failure) [facility])     HOSPITAL COURSE:  * Acute on chronic systolic congestive heart failure, EF: 45% - 50% He was treated with IV Lasix, change to po 40 mg daily after discharge. Echocardiogram, EF: 45% - 50%. His symptoms has much improved. Repeated Chest x-ray show resolved pulmonary edema.  * Accelerated Hypertension Continue Amlodipine, increased carvedilol to 25 mg by mouth twice a day.  * Hyperlipidemia  Continue pravastatin.  * Diabetes  Continue metformin.  * Smoking  Counseled to quit smoking for 4 minutes and offered nicotine patch.  Hypokalemia. Given potassium supplement. DISCHARGE CONDITIONS:   Stable, discharged to home today.  CONSULTS OBTAINED:     DRUG ALLERGIES:  No Known Allergies  DISCHARGE MEDICATIONS:   Discharge Medication List as of 01/15/2016 10:12 AM    START taking these medications   Details  furosemide (LASIX) 40 MG tablet Take 1 tablet (40 mg total) by mouth daily., Starting 01/15/2016, Until Discontinued, Print      CONTINUE these medications which have CHANGED   Details  carvedilol (COREG) 25 MG  tablet Take 1 tablet (25 mg total) by mouth 2 (two) times daily., Starting 01/15/2016, Until Discontinued, Print      CONTINUE these medications which have NOT CHANGED   Details  amLODipine (NORVASC) 5 MG tablet Take 1 tablet (5 mg total) by mouth daily., Starting 08/14/2015, Until Discontinued, Print    metFORMIN (GLUCOPHAGE) 500 MG tablet Take 1 tablet (500 mg total) by mouth 2 (two) times daily with a meal., Starting 08/14/2015, Until Discontinued, Print    albuterol (PROVENTIL HFA) 108 (90 BASE) MCG/ACT inhaler Inhale 2 puffs into the lungs every 4 (four) hours as needed for wheezing or shortness of breath., Starting 08/14/2015, Until Discontinued, Print    lovastatin (MEVACOR) 10 MG tablet Take 10 mg by mouth at bedtime., Until Discontinued, Historical Med    predniSONE (DELTASONE) 20 MG tablet Take 2 tablets (40 mg total) by mouth daily., Starting 08/14/2015, Until Discontinued, Print         DISCHARGE INSTRUCTIONS:   If you experience worsening of your admission symptoms, develop shortness of breath, life threatening emergency, suicidal or homicidal thoughts you must seek medical attention immediately by calling 911 or calling your MD immediately  if symptoms less severe.  You Must read complete instructions/literature along with all the possible adverse reactions/side effects for all the Medicines you take and that have been prescribed to you. Take any new Medicines after you have completely understood and accept all the possible adverse reactions/side effects.   Please note  You were cared for by a hospitalist during your hospital stay. If you have any questions about  your discharge medications or the care you received while you were in the hospital after you are discharged, you can call the unit and asked to speak with the hospitalist on call if the hospitalist that took care of you is not available. Once you are discharged, your primary care physician will handle any further  medical issues. Please note that NO REFILLS for any discharge medications will be authorized once you are discharged, as it is imperative that you return to your primary care physician (or establish a relationship with a primary care physician if you do not have one) for your aftercare needs so that they can reassess your need for medications and monitor your lab values.    Today   SUBJECTIVE   No complaint.   VITAL SIGNS:  Blood pressure 150/92, pulse 83, temperature 98 F (36.7 C), temperature source Oral, resp. rate 20, height  (1.753 m), weight 239 lb (108.41 kg), SpO2 96 %.  I/O:   Intake/Output Summary (Last 24 hours) at 01/15/16 1616 Last data filed at 01/15/16 0835  Gross per 24 hour  Intake    240 ml  Output   1500 ml  Net  -1260 ml    PHYSICAL EXAMINATION:  GENERAL:  40 y.o.-year-old patient lying in the bed with no acute distress.  EYES: Pupils equal, round, reactive to light and accommodation. No scleral icterus. Extraocular muscles intact.  HEENT: Head atraumatic, normocephalic. Oropharynx and nasopharynx clear.  NECK:  Supple, no jugular venous distention. No thyroid enlargement, no tenderness.  LUNGS: Normal breath sounds bilaterally, no wheezing, rales,rhonchi or crepitation. No use of accessory muscles of respiration.  CARDIOVASCULAR: S1, S2 normal. No murmurs, rubs, or gallops.  ABDOMEN: Soft, non-tender, non-distended. Bowel sounds present. No organomegaly or mass.  EXTREMITIES: No pedal edema, cyanosis, or clubbing.  NEUROLOGIC: Cranial nerves II through XII are intact. Muscle strength 5/5 in all extremities. Sensation intact. Gait not checked.  PSYCHIATRIC: The patient is alert and oriented x 3.  SKIN: No obvious rash, lesion, or ulcer.   DATA REVIEW:   CBC  Recent Labs Lab 01/15/16 0509  WBC 9.0  HGB 13.7  HCT 40.1  PLT 178    Chemistries   Recent Labs Lab 01/14/16 1139 01/15/16 0509  NA 139 137  K 4.0 3.3*  CL 105 104  CO2 26 25   GLUCOSE 132* 161*  BUN 12 18  CREATININE 0.97 1.08  CALCIUM 9.5 9.2  MG  --  2.0  AST 145*  --   ALT 255*  --   ALKPHOS 57  --   BILITOT 0.7  --     Cardiac Enzymes  Recent Labs Lab 01/14/16 2348  TROPONINI <0.03    Microbiology Results  No results found for this or any previous visit.  RADIOLOGY:  Dg Chest 2 View  01/15/2016  CLINICAL DATA:  40 year old male with acute on chronic systolic congestive heart failure. Initial encounter. EXAM: CHEST  2 VIEW COMPARISON:  01/14/2016 and earlier. FINDINGS: Cardiomegaly appears stable to slightly increased since December 2016. Cardiac silhouette is stable since yesterday. Other mediastinal contours are within normal limits. Visualized tracheal air column is within normal limits. Regressed pulmonary vascularity since yesterday. No definite pleural effusion. No pneumothorax. No consolidation or confluent pulmonary opacity. No acute osseous abnormality identified. IMPRESSION: 1. Regressed and nearly resolved pulmonary interstitial edema since yesterday. 2. Cardiomegaly appears slightly progressed since December 2016. Electronically Signed   By: Odessa Fleming M.D.   On: 01/15/2016 07:38  Dg Chest Portable 1 View  01/14/2016  CLINICAL DATA:  Patient states he needs air, unable to catch his breath, shortness of breath in the evenings generally but increased last night, unable to sleep, hypertension, diabetes mellitus, smoker EXAM: PORTABLE CHEST 1 VIEW COMPARISON:  Portable exam 1121 hours compared to 08/14/2015 FINDINGS: Enlargement of cardiac silhouette with pulmonary vascular congestion. Mild elongation of thoracic aorta. Slight chronic accentuation of perihilar markings stable. Mild RIGHT basilar atelectasis. Remaining lungs clear. No definite pleural effusion or pneumothorax. IMPRESSION: Enlargement of cardiac silhouette with pulmonary vascular congestion. Chronic accentuation of pulmonary markings with mild RIGHT basilar atelectasis but no definite  acute infiltrate. Electronically Signed   By: Ulyses Southward M.D.   On: 01/14/2016 11:44        Management plans discussed with the patient, family and they are in agreement.  CODE STATUS:  Code Status History    Date Active Date Inactive Code Status Order ID Comments User Context   01/14/2016  4:13 PM 01/15/2016  2:05 PM Full Code [id number]  Altamese Dilling, MD Inpatient      TOTAL TIME TAKING CARE OF THIS PATIENT: 32 minutes.    Shaune Pollack M.D on 01/15/2016 at 4:16 PM  Between 7am to 6pm - Pager - [phone]  After 6pm go to [email or web address] - password [email or web address] [facility]  [facility]  Office  [phone]  CC: Primary care physician; Sherrie Mustache, MD

## 2016-01-15 NOTE — Progress Notes (Signed)
Patient has no acute event overnight. He remained hemodynamically stable. Denied pain. Family stayed at bedside overnight.

## 2016-01-15 NOTE — Discharge Instructions (Signed)
Heart healthy ADA diet. Activity as tolerated. Smoking cessation.

## 2016-01-15 NOTE — Progress Notes (Signed)
Patient received discharge instructions, pt verbalized understanding. IV was removed with no signs of infection. Dressing clean, dry intact. No skin tears or wounds present. Prescription was printed and given to patient. Patient was escorted out with staff member via wheelchair via private auto. No further needs from care management team. Patient given education material on CHF and low sodium diet.

## 2016-01-27 ENCOUNTER — Emergency Department: Payer: Medicaid Other

## 2016-01-27 ENCOUNTER — Encounter: Payer: Self-pay | Admitting: Emergency Medicine

## 2016-01-27 ENCOUNTER — Emergency Department
Admission: EM | Admit: 2016-01-27 | Discharge: 2016-01-27 | Disposition: A | Discharge status: Home / Self Care | Payer: Medicaid Other | Attending: Emergency Medicine | Admitting: Emergency Medicine

## 2016-01-27 DIAGNOSIS — E785 Hyperlipidemia, unspecified: Secondary | ICD-10-CM | POA: Diagnosis not present

## 2016-01-27 DIAGNOSIS — E119 Type 2 diabetes mellitus without complications: Secondary | ICD-10-CM | POA: Diagnosis not present

## 2016-01-27 DIAGNOSIS — Z7984 Long term (current) use of oral hypoglycemic drugs: Secondary | ICD-10-CM | POA: Diagnosis not present

## 2016-01-27 DIAGNOSIS — Z79899 Other long term (current) drug therapy: Secondary | ICD-10-CM | POA: Diagnosis not present

## 2016-01-27 DIAGNOSIS — F17200 Nicotine dependence, unspecified, uncomplicated: Secondary | ICD-10-CM | POA: Insufficient documentation

## 2016-01-27 DIAGNOSIS — I5043 Acute on chronic combined systolic (congestive) and diastolic (congestive) heart failure: Secondary | ICD-10-CM | POA: Diagnosis not present

## 2016-01-27 DIAGNOSIS — Z7952 Long term (current) use of systemic steroids: Secondary | ICD-10-CM | POA: Insufficient documentation

## 2016-01-27 DIAGNOSIS — R0602 Shortness of breath: Secondary | ICD-10-CM | POA: Diagnosis present

## 2016-01-27 DIAGNOSIS — I110 Hypertensive heart disease with heart failure: Secondary | ICD-10-CM | POA: Insufficient documentation

## 2016-01-27 DIAGNOSIS — I5023 Acute on chronic systolic (congestive) heart failure: Secondary | ICD-10-CM

## 2016-01-27 LAB — COMPREHENSIVE METABOLIC PANEL
ALT: 101 U/L — ABNORMAL HIGH (ref 17–63)
AST: 38 U/L (ref 15–41)
Albumin: 4 g/dL (ref 3.5–5.0)
Alkaline Phosphatase: 60 U/L (ref 38–126)
Anion gap: 9 (ref 5–15)
BUN: 12 mg/dL (ref 6–20)
CHLORIDE: 104 mmol/L (ref 101–111)
CO2: 26 mmol/L (ref 22–32)
CREATININE: 1.13 mg/dL (ref 0.61–1.24)
Calcium: 9 mg/dL (ref 8.9–10.3)
Glucose, Bld: 200 mg/dL — ABNORMAL HIGH (ref 65–99)
POTASSIUM: 3.1 mmol/L — AB (ref 3.5–5.1)
SODIUM: 139 mmol/L (ref 135–145)
Total Bilirubin: 0.6 mg/dL (ref 0.3–1.2)
Total Protein: 6.8 g/dL (ref 6.5–8.1)

## 2016-01-27 LAB — BRAIN NATRIURETIC PEPTIDE: B Natriuretic Peptide: 403 pg/mL — ABNORMAL HIGH (ref 0.0–100.0)

## 2016-01-27 LAB — CBC WITH DIFFERENTIAL/PLATELET
BASOS ABS: 0.1 10*3/uL (ref 0–0.1)
Basophils Relative: 1 %
EOS ABS: 0.3 10*3/uL (ref 0–0.7)
HCT: 38.8 % — ABNORMAL LOW (ref 40.0–52.0)
Hemoglobin: 13.1 g/dL (ref 13.0–18.0)
Lymphs Abs: 1.3 10*3/uL (ref 1.0–3.6)
MCH: 31.9 pg (ref 26.0–34.0)
MCHC: 33.8 g/dL (ref 32.0–36.0)
MCV: 94.4 fL (ref 80.0–100.0)
MONO ABS: 0.5 10*3/uL (ref 0.2–1.0)
Monocytes Relative: 7 %
Neutro Abs: 5.3 10*3/uL (ref 1.4–6.5)
Neutrophils Relative %: 70 %
PLATELETS: 161 10*3/uL (ref 150–440)
RBC: 4.11 MIL/uL — ABNORMAL LOW (ref 4.40–5.90)
RDW: 14.4 % (ref 11.5–14.5)
WBC: 7.4 10*3/uL (ref 3.8–10.6)

## 2016-01-27 LAB — TROPONIN I: Troponin I: <0.03 ng/mL (ref <0.031)

## 2016-01-27 MED ORDER — FUROSEMIDE 10 MG/ML IJ SOLN
40.0000 mg | Freq: Once | INTRAMUSCULAR | Status: AC
  Administered 2016-01-27: 40 mg via INTRAVENOUS
  Filled 2016-01-27: qty 4

## 2016-01-27 NOTE — ED Notes (Signed)
Discussed discharge instructions and follow-up care with patient. No questions or concerns at this time. Pt stable at discharge.  

## 2016-01-27 NOTE — Discharge Instructions (Signed)

## 2016-01-27 NOTE — ED Notes (Signed)
Increased SOB x 1 week, states had recent admission for CHF.

## 2016-01-27 NOTE — ED Notes (Signed)
REDS VEST READING= 47% CHEST RULER=11  VEST FITTING TASKS: POSTURE=siting HEIGHT MARKER=talalign CENTER STRIP=  COMMENTS:approved by [employer]

## 2016-01-27 NOTE — ED Provider Notes (Signed)
[facility] Emergency Department Provider Note  ____________________________________________    I have reviewed the triage vital signs and the nursing notes.   HISTORY  Chief Complaint Shortness of Breath    HPI Jack Davidson is a 40 y.o. male who presents with cough and mild shortness of breath. Patient was recently in the hospital with a new diagnosis of congestive heart failure. He complains of still not feeling "100%". He denies chest pain. He denies lower show extremity swelling.No fevers or chills. No recent travel. No calf pain.     Past Medical History  Diagnosis Date  . Essential hypertension   . Tobacco use   . Diabetes mellitus without complication (HCC)   . Obesity   . Hyperlipidemia   . Sleep apnea   . CHF (congestive heart failure) [facility])     Patient Active Problem List   Diagnosis Date Noted  . Acute systolic CHF (congestive heart failure) (HCC) 01/14/2016  . Acute on chronic systolic CHF (congestive heart failure) (HCC) 01/14/2016  . Chronic systolic heart failure (HCC) 07/12/2014  . Essential hypertension   . Tobacco use   . Hyperlipidemia   . Sleep apnea     History reviewed. No pertinent past surgical history.  Current Outpatient Rx  Name  Route  Sig  Dispense  Refill  . albuterol (PROVENTIL HFA) 108 (90 BASE) MCG/ACT inhaler   Inhalation   Inhale 2 puffs into the lungs every 4 (four) hours as needed for wheezing or shortness of breath.   1 Inhaler   0   . amLODipine (NORVASC) 5 MG tablet   Oral   Take 1 tablet (5 mg total) by mouth daily. Patient taking differently: Take 10 mg by mouth daily.    30 tablet   1   . carvedilol (COREG) 25 MG tablet   Oral   Take 1 tablet (25 mg total) by mouth 2 (two) times daily.   60 tablet   1   . furosemide (LASIX) 40 MG tablet   Oral   Take 1 tablet (40 mg total) by mouth daily.   30 tablet   0   . lovastatin (MEVACOR) 10 MG tablet   Oral   Take 10 mg by mouth at  bedtime.         . metFORMIN (GLUCOPHAGE) 500 MG tablet   Oral   Take 1 tablet (500 mg total) by mouth 2 (two) times daily with a meal.   60 tablet   1   . predniSONE (DELTASONE) 20 MG tablet   Oral   Take 2 tablets (40 mg total) by mouth daily.   8 tablet   0     Allergies Review of patient's allergies indicates no known allergies.  Family History  Problem Relation Age of Onset  . Diabetes Mother     Social History Social History  Substance Use Topics  . Smoking status: Current Every Day Smoker -- 0.50 packs/day  . Smokeless tobacco: None  . Alcohol Use: 0.0 oz/week    0 Standard drinks or equivalent per week     Comment: SOCAILLY    Review of Systems  Constitutional: Negative for fever. Eyes: Negative for redness ENT: Negative for sore throat Cardiovascular: Negative for chest pain Respiratory: As above Gastrointestinal: Negative for abdominal pain Genitourinary: Negative for dysuria. Musculoskeletal: Negative for back pain. Skin: Negative for rash. Neurological: Negative for focal weakness Psychiatric: no anxiety    ____________________________________________   PHYSICAL EXAM:  VITAL SIGNS: ED  Triage Vitals  Enc Vitals Group     BP 01/27/16 1014 134/76 mmHg     Pulse Rate 01/27/16 1014 82     Resp 01/27/16 1014 18     Temp 01/27/16 1014 98 F (36.7 C)     Temp Source 01/27/16 1014 Oral     SpO2 01/27/16 1014 93 %     Weight 01/27/16 1014 243 lb (110.224 kg)     Height 01/27/16 1014  (1.778 m)     Head Cir --      Peak Flow --      Pain Score --      Pain Loc --      Pain Edu? --      Excl. in GC? --      Constitutional: Alert and oriented. Well appearing and in no distress.  Eyes: Conjunctivae are normal. No erythema or injection ENT   Head: Normocephalic and atraumatic.   Mouth/Throat: Mucous membranes are moist. Cardiovascular: Normal rate, regular rhythm. Normal and symmetric distal pulses are present in the upper  extremities. No murmurs or rubs  Respiratory: Normal respiratory effort without tachypnea nor retractions. Breath sounds are clear and equal bilaterally.  Gastrointestinal: Soft and non-tender in all quadrants. No distention. There is no CVA tenderness. Genitourinary: deferred Musculoskeletal: Nontender with normal range of motion in all extremities. No lower extremity tenderness nor edema. Neurologic:  Normal speech and language. No gross focal neurologic deficits are appreciated. Skin:  Skin is warm, dry and intact. No rash noted. Psychiatric: Mood and affect are normal. Patient exhibits appropriate insight and judgment.  ____________________________________________    LABS (pertinent positives/negatives)  Labs Reviewed  CBC WITH DIFFERENTIAL/PLATELET - Abnormal; Notable for the following:    RBC 4.11 (*)    HCT 38.8 (*)    All other components within normal limits  COMPREHENSIVE METABOLIC PANEL - Abnormal; Notable for the following:    Potassium 3.1 (*)    Glucose, Bld 200 (*)    ALT 101 (*)    All other components within normal limits  BRAIN NATRIURETIC PEPTIDE - Abnormal; Notable for the following:    B Natriuretic Peptide 403.0 (*)    All other components within normal limits  TROPONIN I    ____________________________________________   EKG  ED ECG REPORT I, Jene Every, the attending physician, personally viewed and interpreted this ECG.  Date: 01/27/2016 EKG Time: 10:39 AM Rate: 76 Rhythm: normal sinus rhythm QRS Axis: normal Intervals: normal ST/T Wave abnormalities: Nonspecific Conduction Disturbances: none    ____________________________________________    RADIOLOGY  Chest x-ray is clear  ____________________________________________   PROCEDURES  Procedure(s) performed: none  Critical Care performed: none  ____________________________________________   INITIAL IMPRESSION / ASSESSMENT AND PLAN / ED COURSE  Pertinent labs & imaging  results that were available during my care of the patient were reviewed by me and considered in my medical decision making (see chart for details).  Patient overall is well-appearing and in no distress. Physical exam is benign. No significant edema. Chest x-ray is clear. We will give a dose of IV Lasix and reevaluate  Patient had good response to Lasix. He is feeling well, no evidence of tachycardia or hypoxia. We will have him follow-up closely with the CHF clinic. He understands the need to return if any worsening shortness of breath.  ____________________________________________   FINAL CLINICAL IMPRESSION(S) / ED DIAGNOSES  Final diagnoses:  Acute on chronic systolic congestive heart failure (HCC)  Jene Every, MD 01/27/16 [phone]

## 2016-02-03 ENCOUNTER — Telehealth: Payer: Self-pay | Admitting: Cardiovascular Disease

## 2016-02-03 NOTE — Telephone Encounter (Signed)
Per Dr. Kirke Corin: "Schedule follow up with me in 1-2 weeks" Left detailed message on pt cell VM of June 23, 1:30pm appt w/Dr. Kirke Corin. Requested CB to confirm.

## 2016-02-04 NOTE — Telephone Encounter (Signed)
S/w pt who confirmed appt date and time. Provided pt instructions on our office location. He verbalized understanding and is agreeable w/plan

## 2016-02-06 ENCOUNTER — Telehealth: Payer: Self-pay | Admitting: Family

## 2016-02-06 ENCOUNTER — Ambulatory Visit: Payer: Medicaid Other | Admitting: Family

## 2016-02-06 NOTE — Telephone Encounter (Signed)
Patient missed his initial appointment at the [facility] on 02/06/16. Will attempt to reschedule.

## 2016-02-14 ENCOUNTER — Encounter: Payer: Self-pay | Admitting: Cardiovascular Disease

## 2016-02-14 ENCOUNTER — Ambulatory Visit (INDEPENDENT_AMBULATORY_CARE_PROVIDER_SITE_OTHER): Payer: Medicaid Other | Admitting: Cardiovascular Disease

## 2016-02-14 ENCOUNTER — Ambulatory Visit: Payer: Medicaid Other | Admitting: Cardiovascular Disease

## 2016-02-14 ENCOUNTER — Encounter (INDEPENDENT_AMBULATORY_CARE_PROVIDER_SITE_OTHER): Payer: Self-pay

## 2016-02-14 VITALS — BP 160/102 | HR 79 | Ht 70.0 in | Wt 244.5 lb

## 2016-02-14 DIAGNOSIS — I5022 Chronic systolic (congestive) heart failure: Secondary | ICD-10-CM | POA: Diagnosis not present

## 2016-02-14 DIAGNOSIS — Z01812 Encounter for preprocedural laboratory examination: Secondary | ICD-10-CM | POA: Diagnosis not present

## 2016-02-14 MED ORDER — SACUBITRIL-VALSARTAN 24-26 MG PO TABS: 1.0000 | ORAL_TABLET | Freq: Two times a day (BID) | ORAL | Status: DC

## 2016-02-14 NOTE — Progress Notes (Signed)
  Cardiology Office Note   Date:  02/14/2016   ID:  Jack Davidson, DOB 01/17/1976, MRN [id number]  PCP:  Fayegh Jadali, MD  Cardiologist:   Dareth Andrew, MD   Chief Complaint  Patient presents with  . other    Follow up from stress test. Meds reviewed by the patient verbally. Pt. c/o shortness of breath.       History of Present Illness: Jack Davidson is a 40 y.o. 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 He has known history of hypertension which has been uncontrolled, type 2 diabetes, obesity, sleep apnea not on CPAP and hyperlipidemia. Previous nuclear stress test in November 2015 showed no evidence of perfusion defects with ejection fraction of 40%.  He lost to follow-up and has not seen me in more than a year. He was hospitalized in May of this year at [facility] with acute on chronic systolic heart failure. He was not taking any diuretics and improved with diuresis. He was discharged on furosemide 40 mg once daily. He had an echocardiogram done which reported an ejection fraction of 45-50%. However, I reviewed the echocardiogram personally and the ejection fraction is 25-30% with severely dilated left ventricle. He underwent a treadmill nuclear stress test by Dr. Jadali which showed no evidence of perfusion defects with an ejection fraction of 26%.  The patient's symptoms somewhat improved with furosemide. However, he continues to complain of significant exertional dyspnea. He does not use his CPAP at night. He denies any chest pain. No dizziness or headache.  He continues to consume significant amount of alcohol and he reports 20 beers on the weekends. I suspect that he is underestimating the amount that his drinking. He continues to smoke as well.    Past Medical History  Diagnosis Date  . Essential hypertension   . Tobacco use   . Diabetes mellitus without complication (HCC)   . Obesity   .  Hyperlipidemia   . Sleep apnea   . CHF (congestive heart failure) (HCC)     History reviewed. No pertinent past surgical history.   Current Outpatient Prescriptions  Medication Sig Dispense Refill  . albuterol (PROVENTIL HFA) 108 (90 BASE) MCG/ACT inhaler Inhale 2 puffs into the lungs every 4 (four) hours as needed for wheezing or shortness of breath. 1 Inhaler 0  . amLODipine (NORVASC) 5 MG tablet Take 1 tablet (5 mg total) by mouth daily. (Patient taking differently: Take 10 mg by mouth daily. ) 30 tablet 1  . carvedilol (COREG) 25 MG tablet Take 1 tablet (25 mg total) by mouth 2 (two) times daily. 60 tablet 1  . furosemide (LASIX) 40 MG tablet Take 1 tablet (40 mg total) by mouth daily. 30 tablet 0  . lovastatin (MEVACOR) 10 MG tablet Take 10 mg by mouth at bedtime.    . metFORMIN (GLUCOPHAGE) 500 MG tablet Take 1 tablet (500 mg total) by mouth 2 (two) times daily with a meal. 60 tablet 1  . predniSONE (DELTASONE) 20 MG tablet Take 2 tablets (40 mg total) by mouth daily. 8 tablet 0  . sacubitril-valsartan (ENTRESTO) 24-26 MG Take 1 tablet by mouth 2 (two) times daily. 60 tablet 3   No current facility-administered medications for this visit.    Allergies:   Review of patient's allergies indicates no known allergies.    Social History:  The patient  reports that he has been smoking.  He does not have any smokeless tobacco history   on file. He reports that he drinks alcohol. He reports that he does not use illicit drugs.   Family History:  The patient's family history includes Diabetes in his mother.    ROS:  Please see the history of present illness.   Otherwise, review of systems are positive for none.   All other systems are reviewed and negative.    PHYSICAL EXAM: VS:  BP 160/102 mmHg  Pulse 79  Ht 5\' 10"  (1.778 m)  Wt 244 lb 8 oz (110.904 kg)  BMI 35.08 kg/m2 , BMI Body mass index is 35.08 kg/(m^2). GEN: Well nourished, well developed, in no acute distress HEENT:  normal Neck: no JVD, carotid bruits, or masses Cardiac: RRR; no murmurs, rubs, or gallops,no edema  Respiratory:  clear to auscultation bilaterally, normal work of breathing GI: soft, nontender, nondistended, + BS MS: no deformity or atrophy Skin: warm and dry, no rash Neuro:  Strength and sensation are intact Psych: euthymic mood, full affect   EKG:  EKG is not ordered today.  Recent EKG was reviewed which showed normal sinus rhythm with left ventricular hypertrophy with repolarization abnormalities.  Recent Labs: 01/15/2016: Magnesium 2.0 01/27/2016: ALT 101*; B Natriuretic Peptide 403.0*; BUN 12; Creatinine, Ser 1.13; Hemoglobin 13.1; Platelets 161; Potassium 3.1*; Sodium 139    Lipid Panel No results found for: CHOL, TRIG, HDL, CHOLHDL, VLDL, LDLCALC, LDLDIRECT    Wt Readings from Last 3 Encounters:  02/14/16 244 lb 8 oz (110.904 kg)  01/27/16 243 lb (110.224 kg)  01/14/16 239 lb (108.41 kg)       ASSESSMENT AND PLAN:  1.  Chronic systolic heart failure: LV systolic function worsened significantly over the last year with most recent ejection fraction of 25-30%. 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 However, he has multiple risk factors for coronary artery disease and given the significant decline in his ejection fraction, I recommend proceeding with right and left cardiac catheterization. I discussed risks and benefits with him. I explained to him the importance of abstinence from alcohol use or at least decreasing the amount. I also explained to him the importance of controlling his blood pressure. For unclear reasons, he is no longer on an ACE inhibitor. I elected to start him on Entresto. Check labs in one week.  2. Essential hypertension: Blood pressure is elevated. Sherryll Burger was added as outlined above.  3. Excessive alcohol use: I discussed with him the importance of abstinence given his degree of cardiomyopathy.  4.  Sleep apnea: The patient has been refusing to use CPAP and he actually gave away his device.    Disposition:   FU with me in 1 month  Signed,  Lorine Bears, MD  02/14/2016 3:35 PM    [facility]

## 2016-02-14 NOTE — Patient Instructions (Addendum)
Medication Instructions:  Your physician has recommended you make the following change in your medication:  START taking entresto 24-26mg  twice daily   Labwork: BMET, CBC, PT/INR in one week  Testing/Procedures: Your physician has requested that you have a cardiac catheterization. Cardiac catheterization is used to diagnose and/or treat various heart conditions. Doctors may recommend this procedure for a number of different reasons. The most common reason is to evaluate chest pain. Chest pain can be a symptom of coronary artery disease (CAD), and cardiac catheterization can show whether plaque is narrowing or blocking your heart's arteries. This procedure is also used to evaluate the valves, as well as measure the blood flow and oxygen levels in different parts of your heart. For further information please visit [email or web address] Please follow instruction sheet, as given.  [facility] Cardiac Cath Instructions   You are scheduled for a Cardiac Cath on: July 10  Please arrive at 9:30am on the day of your procedure  Please expect a call from our [facility] to pre-register you  Do not eat/drink anything after midnight  Someone will need to drive you home  It is recommended someone be with you for the first 24 hours after your procedure  Wear clothes that are easy to get on/off and wear slip on shoes if possible   Medications bring a current list of all medications with you   _xx__ Do not take these medications before your procedure: DO NOT TAKE METFORMIN FOR 24 HOURS BEFORE AND FOR 48 HOURS AFTER YOUR PROCEDURE.   Day of your procedure: Arrive at the [facility] entrance.  Free valet service is available.  After entering the [facility] please check-in at the registration desk (1st desk on your right) to receive your armband. After receiving your armband someone will escort you to the cardiac cath/special procedures waiting area.  The usual length of stay after  your procedure is about 2 to 3 hours.  This can vary.  If you have any questions, please call our office at [phone], or you may call the cardiac cath lab at [facility] directly at [phone]   Follow-Up: Your physician recommends that you schedule a follow-up appointment in: one month with Dr. Kirke Corin.    Any Other Special Instructions Will Be Listed Below (If Applicable).     If you need a refill on your cardiac medications before your next appointment, please call your pharmacy.  Angiogram An angiogram, also called angiography, is a procedure used to look at the blood vessels. In this procedure, dye is injected through a long, thin tube (catheter) into an artery. X-rays are then taken. The X-rays will show if there is a blockage or problem in a blood vessel.  LET [facility] CARE PROVIDER KNOW ABOUT:  Any allergies you have, including allergies to shellfish or contrast dye.   All medicines you are taking, including vitamins, herbs, eye drops, creams, and over-the-counter medicines.   Previous problems you or members of your family have had with the use of anesthetics.   Any blood disorders you have.   Previous surgeries you have had.  Any previous kidney problems or failure you have had.  Medical conditions you have.   Possibility of pregnancy, if this applies. RISKS AND COMPLICATIONS Generally, an angiogram is a safe procedure. However, as with any procedure, problems can occur. Possible problems include:  Injury to the blood vessels, including rupture or bleeding.  Infection or bruising at the catheter site.  Allergic reaction to the dye  or contrast used.  Kidney damage from the dye or contrast used.  Blood clots that can lead to a stroke or heart attack. BEFORE THE PROCEDURE  Do not eat or drink after midnight on the night before the procedure, or as directed by your health care provider.   Ask your health care provider if you may drink enough water to  take any needed medicines the morning of the procedure.  PROCEDURE  You may be given a medicine to help you relax (sedative) before and during the procedure. This medicine is given through an IV access tube that is inserted into one of your veins.   The area where the catheter will be inserted will be washed and shaved. This is usually done in the groin but may be done in the fold of your arm (near your elbow) or in the wrist.  A medicine will be given to numb the area where the catheter will be inserted (local anesthetic).  The catheter will be inserted with a guide wire into an artery. The catheter is guided by using a type of X-ray (fluoroscopy) to the blood vessel being examined.   Dye is then injected into the catheter, and X-rays are taken. The dye helps to show where any narrowing or blockages are located.  AFTER THE PROCEDURE   If the procedure is done through the leg, you will be kept in bed lying flat for several hours. You will be instructed to not bend or cross your legs.  The insertion site will be checked frequently.  The pulse in your feet or wrist will be checked frequently.  Additional blood tests, X-rays, and electrocardiography may be done.   You may need to stay in the hospital overnight for observation.    This information is not intended to replace advice given to you by your health care provider. Make sure you discuss any questions you have with your health care provider.   Document Released: 05/20/2005 Document Revised: 08/31/2014 Document Reviewed: 01/11/2013 Elsevier Interactive Patient Education 2016 Else[employer] Inc. Angiogram, Care After Refer to this sheet in the next few weeks. These instructions provide you with information about caring for yourself after your procedure. Your health care provider may also give you more specific instructions. Your treatment has been planned according to current medical practices, but problems sometimes occur. Call your  health care provider if you have any problems or questions after your procedure. WHAT TO EXPECT AFTER THE PROCEDURE After your procedure, it is typical to have the following:  Bruising at the catheter insertion site that usually fades within 1-2 weeks.  Blood collecting in the tissue (hematoma) that may be painful to the touch. It should usually decrease in size and tenderness within 1-2 weeks. HOME CARE INSTRUCTIONS  Take medicines only as directed by your health care provider.  You may shower 24-48 hours after the procedure or as directed by your health care provider. Remove the bandage (dressing) and gently wash the site with plain soap and water. Pat the area dry with a clean towel. Do not rub the site, because this may cause bleeding.  Do not take baths, swim, or use a hot tub until your health care provider approves.  Check your insertion site every day for redness, swelling, or drainage.  Do not apply powder or lotion to the site.  Do not lift over 10 lb (4.5 kg) for 5 days after your procedure or as directed by your health care provider.  Ask your health care  provider when it is okay to:  Return to work or school.  Resume usual physical activities or sports.  Resume sexual activity.  Do not drive home if you are discharged the same day as the procedure. Have someone else drive you.  You may drive 24 hours after the procedure unless otherwise instructed by your health care provider.  Do not operate machinery or power tools for 24 hours after the procedure or as directed by your health care provider.  If your procedure was done as an outpatient procedure, which means that you went home the same day as your procedure, a responsible adult should be with you for the first 24 hours after you arrive home.  Keep all follow-up visits as directed by your health care provider. This is important. SEEK MEDICAL CARE IF:  You have a fever.  You have chills.  You have increased  bleeding from the catheter insertion site. Hold pressure on the site. SEEK IMMEDIATE MEDICAL CARE IF:  You have unusual pain at the catheter insertion site.  You have redness, warmth, or swelling at the catheter insertion site.  You have drainage (other than a small amount of blood on the dressing) from the catheter insertion site.  The catheter insertion site is bleeding, and the bleeding does not stop after 30 minutes of holding steady pressure on the site.  The area near or just beyond the catheter insertion site becomes pale, cool, tingly, or numb.   This information is not intended to replace advice given to you by your health care provider. Make sure you discuss any questions you have with your health care provider.   Document Released: 02/26/2005 Document Revised: 08/31/2014 Document Reviewed: 01/11/2013 Elsevier Interactive Patient Education [employer]

## 2016-02-21 ENCOUNTER — Other Ambulatory Visit (INDEPENDENT_AMBULATORY_CARE_PROVIDER_SITE_OTHER): Payer: Medicaid Other

## 2016-02-21 DIAGNOSIS — Z01812 Encounter for preprocedural laboratory examination: Secondary | ICD-10-CM | POA: Diagnosis not present

## 2016-02-22 LAB — BASIC METABOLIC PANEL
BUN/Creatinine Ratio: 13 (ref 9–20)
BUN: 14 mg/dL (ref 6–24)
CALCIUM: 9.1 mg/dL (ref 8.7–10.2)
CHLORIDE: 101 mmol/L (ref 96–106)
CO2: 20 mmol/L (ref 18–29)
Creatinine, Ser: 1.05 mg/dL (ref 0.76–1.27)
GFR calc non Af Amer: 88 mL/min/{1.73_m2} (ref >59)
GFR, EST AFRICAN AMERICAN: 101 mL/min/{1.73_m2} (ref >59)
Glucose: 121 mg/dL — ABNORMAL HIGH (ref 65–99)
POTASSIUM: 3.8 mmol/L (ref 3.5–5.2)
Sodium: 142 mmol/L (ref 134–144)

## 2016-02-22 LAB — PROTIME-INR
INR: 1 (ref 0.8–1.2)
PROTHROMBIN TIME: 10.6 s (ref 9.1–12.0)

## 2016-02-22 LAB — CBC
HEMATOCRIT: 44.5 % (ref 37.5–51.0)
HEMOGLOBIN: 14.7 g/dL (ref 12.6–17.7)
MCH: 31.1 pg (ref 26.6–33.0)
MCHC: 33 g/dL (ref 31.5–35.7)
MCV: 94 fL (ref 79–97)
Platelets: 179 10*3/uL (ref 150–379)
RBC: 4.73 x10E6/uL (ref 4.14–5.80)
RDW: 14.4 % (ref 12.3–15.4)
WBC: 5.9 10*3/uL (ref 3.4–10.8)

## 2016-03-02 ENCOUNTER — Telehealth: Payer: Self-pay | Admitting: Cardiovascular Disease

## 2016-03-02 ENCOUNTER — Ambulatory Visit
Admission: RE | Admit: 2016-03-02 | Discharge: 2016-03-02 | Disposition: A | Discharge status: Home / Self Care | Payer: Medicaid Other | Source: Ambulatory Visit | Attending: Cardiovascular Disease | Admitting: Cardiovascular Disease

## 2016-03-02 ENCOUNTER — Encounter
Admission: RE | Disposition: A | Discharge status: Home / Self Care | Payer: Self-pay | Source: Ambulatory Visit | Attending: Cardiovascular Disease

## 2016-03-02 ENCOUNTER — Encounter: Payer: Self-pay | Admitting: *Deleted

## 2016-03-02 DIAGNOSIS — G4730 Sleep apnea, unspecified: Secondary | ICD-10-CM | POA: Diagnosis not present

## 2016-03-02 DIAGNOSIS — Z79899 Other long term (current) drug therapy: Secondary | ICD-10-CM | POA: Insufficient documentation

## 2016-03-02 DIAGNOSIS — E119 Type 2 diabetes mellitus without complications: Secondary | ICD-10-CM | POA: Diagnosis not present

## 2016-03-02 DIAGNOSIS — I5022 Chronic systolic (congestive) heart failure: Secondary | ICD-10-CM | POA: Diagnosis not present

## 2016-03-02 DIAGNOSIS — Z6834 Body mass index (BMI) 34.0-34.9, adult: Secondary | ICD-10-CM | POA: Diagnosis not present

## 2016-03-02 DIAGNOSIS — Z7984 Long term (current) use of oral hypoglycemic drugs: Secondary | ICD-10-CM | POA: Insufficient documentation

## 2016-03-02 DIAGNOSIS — E669 Obesity, unspecified: Secondary | ICD-10-CM | POA: Insufficient documentation

## 2016-03-02 DIAGNOSIS — Z7952 Long term (current) use of systemic steroids: Secondary | ICD-10-CM | POA: Diagnosis not present

## 2016-03-02 DIAGNOSIS — F17200 Nicotine dependence, unspecified, uncomplicated: Secondary | ICD-10-CM | POA: Diagnosis not present

## 2016-03-02 DIAGNOSIS — I255 Ischemic cardiomyopathy: Secondary | ICD-10-CM | POA: Insufficient documentation

## 2016-03-02 DIAGNOSIS — I272 Other secondary pulmonary hypertension: Secondary | ICD-10-CM | POA: Insufficient documentation

## 2016-03-02 DIAGNOSIS — I110 Hypertensive heart disease with heart failure: Secondary | ICD-10-CM | POA: Insufficient documentation

## 2016-03-02 DIAGNOSIS — R9439 Abnormal result of other cardiovascular function study: Secondary | ICD-10-CM | POA: Insufficient documentation

## 2016-03-02 DIAGNOSIS — R931 Abnormal findings on diagnostic imaging of heart and coronary circulation: Secondary | ICD-10-CM

## 2016-03-02 DIAGNOSIS — E785 Hyperlipidemia, unspecified: Secondary | ICD-10-CM | POA: Insufficient documentation

## 2016-03-02 HISTORY — PX: CARDIAC CATHETERIZATION: SHX172

## 2016-03-02 LAB — GLUCOSE, CAPILLARY: GLUCOSE-CAPILLARY: 106 mg/dL — AB (ref 65–99)

## 2016-03-02 SURGERY — RIGHT/LEFT HEART CATH AND CORONARY ANGIOGRAPHY
Anesthesia: Moderate Sedation

## 2016-03-02 MED ORDER — MIDAZOLAM HCL 2 MG/2ML IJ SOLN
INTRAMUSCULAR | Status: DC | PRN
  Administered 2016-03-02 (×2): 1 mg via INTRAVENOUS

## 2016-03-02 MED ORDER — FENTANYL CITRATE (PF) 100 MCG/2ML IJ SOLN
INTRAMUSCULAR | Status: DC | PRN
  Administered 2016-03-02 (×2): 25 ug via INTRAVENOUS

## 2016-03-02 MED ORDER — HEPARIN (PORCINE) IN NACL 2-0.9 UNIT/ML-% IJ SOLN
INTRAMUSCULAR | Status: AC
  Filled 2016-03-02: qty 500

## 2016-03-02 MED ORDER — MIDAZOLAM HCL 2 MG/2ML IJ SOLN
INTRAMUSCULAR | Status: AC
Start: 2016-03-02 — End: 2016-03-02
  Filled 2016-03-02: qty 2

## 2016-03-02 MED ORDER — HEPARIN SODIUM (PORCINE) 1000 UNIT/ML IJ SOLN
INTRAMUSCULAR | Status: DC | PRN
  Administered 2016-03-02: 5000 [IU] via INTRAVENOUS

## 2016-03-02 MED ORDER — FENTANYL CITRATE (PF) 100 MCG/2ML IJ SOLN
INTRAMUSCULAR | Status: AC
  Filled 2016-03-02: qty 2

## 2016-03-02 MED ORDER — IOPAMIDOL (ISOVUE-300) INJECTION 61%
INTRAVENOUS | Status: DC | PRN
Start: 2016-03-02 — End: 2016-03-02
  Administered 2016-03-02: 84 mL via INTRA_ARTERIAL

## 2016-03-02 MED ORDER — HEPARIN SODIUM (PORCINE) 1000 UNIT/ML IJ SOLN
INTRAMUSCULAR | Status: AC
  Filled 2016-03-02: qty 1

## 2016-03-02 MED ORDER — VERAPAMIL HCL 2.5 MG/ML IV SOLN
INTRAVENOUS | Status: AC
  Filled 2016-03-02: qty 2

## 2016-03-02 SURGICAL SUPPLY — 10 items
CATH 5F 110X4 TIG (CATHETERS) ×2 IMPLANT
CATH BALLN WEDGE 5F 110CM (CATHETERS) ×2 IMPLANT
CATH INFINITI 5FR ANG PIGTAIL (CATHETERS) ×2 IMPLANT
DEVICE RAD TR BAND REGULAR (VASCULAR PRODUCTS) ×2 IMPLANT
GLIDESHEATH SLEND SS 6F .021 (SHEATH) ×4 IMPLANT
KIT MANI 3VAL PERCEP (MISCELLANEOUS) ×2 IMPLANT
KIT RIGHT HEART (MISCELLANEOUS) ×2 IMPLANT
PACK CARDIAC CATH (CUSTOM PROCEDURE TRAY) ×2 IMPLANT
WIRE HITORQ VERSACORE ST 145CM (WIRE) ×2 IMPLANT
WIRE SAFE-T 1.5MM-J .035X260CM (WIRE) ×2 IMPLANT

## 2016-03-02 NOTE — Interval H&P Note (Signed)
Cath Lab Visit (complete for each Cath Lab visit)  Clinical Evaluation Leading to the Procedure:   ACS: No.  Non-ACS:    Anginal Classification: CCS III  Anti-ischemic medical therapy: Maximal Therapy (2 or more classes of medications)  Non-Invasive Test Results: High-risk stress test findings: cardiac mortality >3%/year  Prior CABG: No previous CABG      History and Physical Interval Note:  03/02/2016 11:05 AM  Jack Davidson  has presented today for surgery, with the diagnosis of Chronic systolic heart failure  The various methods of treatment have been discussed with the patient and family. After consideration of risks, benefits and other options for treatment, the patient has consented to  Procedure(s): Right/Left Heart Cath and Coronary Angiography (N/A) as a surgical intervention .  The patient's history has been reviewed, patient examined, no change in status, stable for surgery.  I have reviewed the patient's chart and labs.  Questions were answered to the patient's satisfaction.     Lorine Bears

## 2016-03-02 NOTE — Discharge Instructions (Signed)
Resume Metformin after 2 days. Groin Insertion Instructions-If you lose feeling or develop tingling or pain in your leg or foot after the procedure, please walk around first.  If the discomfort does not improve , contact your physician and proceed to the nearest emergency room.  Loss of feeling in your leg might mean that a blockage has formed in the artery and this can be appropriately treated.  Limit your activity for the next two days after your procedure.  Avoid stooping, bending, heavy lifting or exertion as this may put pressure on the insertion site.  Resume normal activities in 48 hours.  You may shower after 24 hours but avoid excessive warm water and do not scrub the site.  Remove clear dressing in 48 hours.  If you have had a closure device inserted, do not soak in a tub bath or a hot tub for at least one week.  No driving for 48 hours after discharge.  After the procedure, check the insertion site occasionally.  If any oozing occurs or there is apparent swelling, firm pressure over the site will prevent a bruise from forming.  You can not hurt anything by pressing directly on the site.  The pressure stops the bleeding by allowing a small clot to form.  If the bleeding continues after the pressure has been applied for more than 15 minutes, call 911 or go to the nearest emergency room.    The x-ray dye causes you to pass a considerate amount of urine.  For this reason, you will be asked to drink plenty of liquids after the procedure to prevent dehydration.  You may resume you regular diet.  Avoid caffeine products.    For pain at the site of your procedure, take non-aspirin medicines such as Tylenol.  Medications: A. Hold Metformin for 48 hours if applicable.  B. Continue taking all your present medications at home unless your doctor prescribes any changes.Keep right wrist elevated on pillow above the heart for today.  Watch right wrist for evidence of bleeding or hematoma.. If bleeding or hematoma  noted, hold pressure over the site for at least 15 minutes and notify the physician.  No blending or flexing of the wrist--no lifting for the remainder of the day or for 2 weeks after your procedure. Notify the physician for evidence of infection or if you get a temperature.

## 2016-03-02 NOTE — Telephone Encounter (Signed)
Left detailed message on pt cell VM regarding catheterization. Requested call back to confirm.

## 2016-03-02 NOTE — H&P (View-Only) (Signed)
Cardiology Office Note   Date:  02/14/2016   ID:  Jack Davidson, DOB Jun 30, 1976, MRN [id number]  PCP:  Sherrie Mustache, MD  Cardiologist:   Lorine Bears, MD   Chief Complaint  Patient presents with  . other    Follow up from stress test. Meds reviewed by the patient verbally. Pt. c/o shortness of breath.       History of Present Illness: Jack Davidson is a 40 y.o. 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 He has known history of hypertension which has been uncontrolled, type 2 diabetes, obesity, sleep apnea not on CPAP and hyperlipidemia. Previous nuclear stress test in November 2015 showed no evidence of perfusion defects with ejection fraction of 40%.  He lost to follow-up and has not seen me in more than a year. He was hospitalized in May of this year at [facility] with acute on chronic systolic heart failure. He was not taking any diuretics and improved with diuresis. He was discharged on furosemide 40 mg once daily. He had an echocardiogram done which reported an ejection fraction of 45-50%. However, I reviewed the echocardiogram personally and the ejection fraction is 25-30% with severely dilated left ventricle. He underwent a treadmill nuclear stress test by Dr. Dario Guardian which showed no evidence of perfusion defects with an ejection fraction of 26%.  The patient's symptoms somewhat improved with furosemide. However, he continues to complain of significant exertional dyspnea. He does not use his CPAP at night. He denies any chest pain. No dizziness or headache.  He continues to consume significant amount of alcohol and he reports 20 beers on the weekends. I suspect that he is underestimating the amount that his drinking. He continues to smoke as well.    Past Medical History  Diagnosis Date  . Essential hypertension   . Tobacco use   . Diabetes mellitus without complication (HCC)   . Obesity   .  Hyperlipidemia   . Sleep apnea   . CHF (congestive heart failure) (HCC)     History reviewed. No pertinent past surgical history.   Current Outpatient Prescriptions  Medication Sig Dispense Refill  . albuterol (PROVENTIL HFA) 108 (90 BASE) MCG/ACT inhaler Inhale 2 puffs into the lungs every 4 (four) hours as needed for wheezing or shortness of breath. 1 Inhaler 0  . amLODipine (NORVASC) 5 MG tablet Take 1 tablet (5 mg total) by mouth daily. (Patient taking differently: Take 10 mg by mouth daily. ) 30 tablet 1  . carvedilol (COREG) 25 MG tablet Take 1 tablet (25 mg total) by mouth 2 (two) times daily. 60 tablet 1  . furosemide (LASIX) 40 MG tablet Take 1 tablet (40 mg total) by mouth daily. 30 tablet 0  . lovastatin (MEVACOR) 10 MG tablet Take 10 mg by mouth at bedtime.    . metFORMIN (GLUCOPHAGE) 500 MG tablet Take 1 tablet (500 mg total) by mouth 2 (two) times daily with a meal. 60 tablet 1  . predniSONE (DELTASONE) 20 MG tablet Take 2 tablets (40 mg total) by mouth daily. 8 tablet 0  . sacubitril-valsartan (ENTRESTO) 24-26 MG Take 1 tablet by mouth 2 (two) times daily. 60 tablet 3   No current facility-administered medications for this visit.    Allergies:   Review of patient's allergies indicates no known allergies.    Social History:  The patient  reports that he has been smoking.  He does not have any smokeless tobacco history  on file. He reports that he drinks alcohol. He reports that he does not use illicit drugs.   Family History:  The patient's family history includes Diabetes in his mother.    ROS:  Please see the history of present illness.   Otherwise, review of systems are positive for none.   All other systems are reviewed and negative.    PHYSICAL EXAM: VS:  BP 160/102 mmHg  Pulse 79  Ht 5\' 10"  (1.778 m)  Wt 244 lb 8 oz (110.904 kg)  BMI 35.08 kg/m2 , BMI Body mass index is 35.08 kg/(m^2). GEN: Well nourished, well developed, in no acute distress HEENT:  normal Neck: no JVD, carotid bruits, or masses Cardiac: RRR; no murmurs, rubs, or gallops,no edema  Respiratory:  clear to auscultation bilaterally, normal work of breathing GI: soft, nontender, nondistended, + BS MS: no deformity or atrophy Skin: warm and dry, no rash Neuro:  Strength and sensation are intact Psych: euthymic mood, full affect   EKG:  EKG is not ordered today.  Recent EKG was reviewed which showed normal sinus rhythm with left ventricular hypertrophy with repolarization abnormalities.  Recent Labs: 01/15/2016: Magnesium 2.0 01/27/2016: ALT 101*; B Natriuretic Peptide 403.0*; BUN 12; Creatinine, Ser 1.13; Hemoglobin 13.1; Platelets 161; Potassium 3.1*; Sodium 139    Lipid Panel No results found for: CHOL, TRIG, HDL, CHOLHDL, VLDL, LDLCALC, LDLDIRECT    Wt Readings from Last 3 Encounters:  02/14/16 244 lb 8 oz (110.904 kg)  01/27/16 243 lb (110.224 kg)  01/14/16 239 lb (108.41 kg)       ASSESSMENT AND PLAN:  1.  Chronic systolic heart failure: LV systolic function worsened significantly over the last year with most recent ejection fraction of 25-30%. 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 However, he has multiple risk factors for coronary artery disease and given the significant decline in his ejection fraction, I recommend proceeding with right and left cardiac catheterization. I discussed risks and benefits with him. I explained to him the importance of abstinence from alcohol use or at least decreasing the amount. I also explained to him the importance of controlling his blood pressure. For unclear reasons, he is no longer on an ACE inhibitor. I elected to start him on Entresto. Check labs in one week.  2. Essential hypertension: Blood pressure is elevated. Sherryll Burger was added as outlined above.  3. Excessive alcohol use: I discussed with him the importance of abstinence given his degree of cardiomyopathy.  4.  Sleep apnea: The patient has been refusing to use CPAP and he actually gave away his device.    Disposition:   FU with me in 1 month  Signed,  Lorine Bears, MD  02/14/2016 3:35 PM    [facility]

## 2016-03-02 NOTE — Telephone Encounter (Addendum)
S/w pt wife, Annabelle Harman, who reports pt took metformin yesterday as scheduled. He forgot he was to hold it. Per Dr. Kirke Corin, may proceed w/cath.  Left message on pt cell VM  S/w pt wife, Annabelle Harman, of Dr. Jari Sportsman approval to proceed w/cath. She understands pt is not to take metformin for 48 hours after procedure.

## 2016-03-03 ENCOUNTER — Telehealth: Payer: Self-pay | Admitting: Cardiovascular Disease

## 2016-03-03 NOTE — Telephone Encounter (Signed)
Patient's wife called needing correct post op instructions from cath. Please call at # [phone].

## 2016-03-03 NOTE — Telephone Encounter (Signed)
S/w pt wife who reports pt has slight bruising at cath site. Denies any numbness, pale, or any other sx. Advised wife to mark area of bruising and note if it continues to spread. She is aware if pt were to have uncontrolled bleeding, he would need to be seen emergently. Reviewed after-care angiography instructions. Wife verbalized understanding and is appreciative of the call.

## 2016-03-20 ENCOUNTER — Ambulatory Visit (INDEPENDENT_AMBULATORY_CARE_PROVIDER_SITE_OTHER): Payer: Medicaid Other | Admitting: Cardiovascular Disease

## 2016-03-20 ENCOUNTER — Encounter: Payer: Self-pay | Admitting: Cardiovascular Disease

## 2016-03-20 VITALS — BP 132/72 | HR 73 | Ht 70.0 in | Wt 243.8 lb

## 2016-03-20 DIAGNOSIS — I5022 Chronic systolic (congestive) heart failure: Secondary | ICD-10-CM | POA: Diagnosis not present

## 2016-03-20 DIAGNOSIS — I10 Essential (primary) hypertension: Secondary | ICD-10-CM

## 2016-03-20 MED ORDER — SACUBITRIL-VALSARTAN 49-51 MG PO TABS: 1.0000 | ORAL_TABLET | Freq: Two times a day (BID) | ORAL | 3 refills | Status: DC

## 2016-03-20 NOTE — Progress Notes (Signed)
Cardiology Office Note   Date:  03/20/2016   ID:  Jack Davidson, DOB September 18, 1975, MRN [id number]  PCP:  Sherrie Mustache, MD  Cardiologist:   Lorine Bears, MD   Chief Complaint  Patient presents with  . Other    1 month follow up. Meds reviewed by the patient verbally. Pt. c/o shortness of breath.       History of Present Illness: Jack Davidson is a 40 y.o. 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 He has known history of hypertension which has been uncontrolled, type 2 diabetes, obesity, sleep apnea not on CPAP and hyperlipidemia. Previous nuclear stress test in November 2015 showed no evidence of perfusion defects with ejection fraction of 40%.  He was hospitalized in May of this year at [facility] with acute on chronic systolic heart failure. He was not taking any diuretics and improved with diuresis. He was discharged on furosemide 40 mg once daily. He had an echocardiogram done which reported an ejection fraction of 45-50%. However, I reviewed the echocardiogram personally and the ejection fraction is 25-30% with severely dilated left ventricle. He underwent a treadmill nuclear stress test by Dr. Dario Guardian which showed no evidence of perfusion defects with an ejection fraction of 26%. I proceeded with a right and left cardiac catheterization which showed normal coronary arteries, severely reduced LV systolic function with an ejection fraction of 25-30% with severely dilated left ventricle. Right heart catheterization showed moderately elevated filling pressures with pulmonary capillary wedge pressure of 20 mmHg, mild pulmonary hypertension and normal cardiac output at 6.7 L/m.  He is doing reasonably well overall with no chest pain. Dyspnea is stable with no orthopnea or PND. He is taking his medications regularly and cut down on alcohol intake.    Past Medical History:  Diagnosis Date  . CHF (congestive heart  failure) (HCC)   . Diabetes mellitus without complication (HCC)   . Essential hypertension   . Hyperlipidemia   . Obesity   . Sleep apnea   . Tobacco use     Past Surgical History:  Procedure Laterality Date  . CARDIAC CATHETERIZATION N/A 03/02/2016   Procedure: Right/Left Heart Cath and Coronary Angiography;  Surgeon: Iran Ouch, MD;  Location: [facility] INVASIVE CV LAB;  Service: Cardiovascular;  Laterality: N/A;     Current Outpatient Prescriptions  Medication Sig Dispense Refill  . albuterol (PROVENTIL HFA) 108 (90 BASE) MCG/ACT inhaler Inhale 2 puffs into the lungs every 4 (four) hours as needed for wheezing or shortness of breath. 1 Inhaler 0  . Ascorbic Acid (VITAMIN C PO) Take 1 tablet by mouth daily.    . carvedilol (COREG) 25 MG tablet Take 1 tablet (25 mg total) by mouth 2 (two) times daily. 60 tablet 1  . furosemide (LASIX) 40 MG tablet Take 1 tablet (40 mg total) by mouth daily. (Patient taking differently: Take 40 mg by mouth 2 (two) times daily. ) 30 tablet 0  . lovastatin (MEVACOR) 10 MG tablet Take 10 mg by mouth at bedtime.    . metFORMIN (GLUCOPHAGE) 500 MG tablet Take 1 tablet (500 mg total) by mouth 2 (two) times daily with a meal. 60 tablet 1  . sacubitril-valsartan (ENTRESTO) 49-51 MG Take 1 tablet by mouth 2 (two) times daily. 60 tablet 3   No current facility-administered medications for this visit.     Allergies:   Review of patient's allergies indicates no known allergies.    Social  History:  The patient  reports that he has been smoking Cigarettes.  He has been smoking about 0.50 packs per day. He has never used smokeless tobacco. He reports that he drinks alcohol. He reports that he does not use drugs.   Family History:  The patient's family history includes Diabetes in his mother.    ROS:  Please see the history of present illness.   Otherwise, review of systems are positive for none.   All other systems are reviewed and negative.    PHYSICAL  EXAM: VS:  BP 132/72 (BP Location: Left Arm, Patient Position: Sitting, Cuff Size: Normal)   Pulse 73   Ht  (1.778 m)   Wt 243 lb 12 oz (110.6 kg)   BMI 34.97 kg/m  , BMI Body mass index is 34.97 kg/m. GEN: Well nourished, well developed, in no acute distress  HEENT: normal  Neck: no JVD, carotid bruits, or masses Cardiac: RRR; no murmurs, rubs, or gallops,no edema  Respiratory:  clear to auscultation bilaterally, normal work of breathing GI: soft, nontender, nondistended, + BS MS: no deformity or atrophy  Skin: warm and dry, no rash Neuro:  Strength and sensation are intact Psych: euthymic mood, full affect Right radial pulse is normal with no hematoma.  EKG:  EKG is not ordered today.    Recent Labs: 01/15/2016: Magnesium 2.0 01/27/2016: ALT 101; B Natriuretic Peptide 403.0; Hemoglobin 13.1 02/21/2016: BUN 14; Creatinine, Ser 1.05; Platelets 179; Potassium 3.8; Sodium 142    Lipid Panel No results found for: CHOL, TRIG, HDL, CHOLHDL, VLDL, LDLCALC, LDLDIRECT    Wt Readings from Last 3 Encounters:  03/20/16 243 lb 12 oz (110.6 kg)  03/02/16 240 lb (108.9 kg)  02/14/16 244 lb 8 oz (110.9 kg)       ASSESSMENT AND PLAN:  1.  Chronic systolic heart failure: Due to nonischemic cardiomyopathy with ejection fraction of 25-30%. This is likely due to hypertensive heart disease or alcohol induced cardiomyopathy. I strongly advised him to avoid alcohol intake.  I elected to double the dose of Entresto  and discontinue amlodipine in order to up titrate the medication. He is to come back in 2 weeks to see if the dose can be increased further. We'll check basic metabolic profile at that time.   2. Essential hypertension: Blood pressure is reasonable but I made changes as outlined above.   3. Excessive alcohol use: he cut down on alcohol intake.   4. Sleep apnea: The patient has been refusing to use CPAP and he actually gave away his device.    Disposition:   FU with APP in  2 weeks.  Signed,  Lorine Bears, MD  03/20/2016 3:51 PM     [facility]

## 2016-03-20 NOTE — Patient Instructions (Addendum)
Medication Instructions:  Your physician has recommended you make the following change in your medication:  INCREASE entresto to 49-51mg  twice daily STOP taking amlodipine   Labwork: BMET in two weeks  Testing/Procedures: none  Follow-Up: Your physician recommends that you schedule a follow-up appointment in: two weeks with Ward Givens, NP or Eula Listen, PA-C. Labs will be drawn at that time.    Any Other Special Instructions Will Be Listed Below (If Applicable).     If you need a refill on your cardiac medications before your next appointment, please call your pharmacy.

## 2016-04-16 ENCOUNTER — Ambulatory Visit: Payer: Medicaid Other | Admitting: Nurse Practitioner

## 2016-04-16 ENCOUNTER — Encounter: Payer: Self-pay | Admitting: Nurse Practitioner

## 2016-04-16 ENCOUNTER — Encounter: Payer: Self-pay | Admitting: *Deleted

## 2016-04-16 DIAGNOSIS — I119 Hypertensive heart disease without heart failure: Secondary | ICD-10-CM | POA: Insufficient documentation

## 2016-04-16 NOTE — Progress Notes (Deleted)
Office Visit    Patient Name: Jack Davidson Date of Encounter: 04/16/2016  Primary Care Provider:  Sherrie MustacheFayegh Jadali, MD Primary Cardiologist:  Judie PetitM. Kirke CorinArida, MD   Chief Complaint    40 y/o ? with a h/o NICM/hypertensive CM, who presents for f/u.  Past Medical History    Past Medical History:  Diagnosis Date  . Chronic systolic CHF (congestive heart failure) (HCC)    a. 12/2015 Echo: EF 25-30%, sev dil LV.  . Diabetes mellitus without complication (HCC)   . ETOH abuse   . Hyperlipidemia   . Hypertensive heart disease   . NICM (nonischemic cardiomyopathy) (HCC)    a. 2015 Neg MV, EF 40%;  b. 12/2015 Echo: EF 25-30% sev dil LV;  c. 12/2015 Cath: nl cors, mild PAH, CO 6.7L/m, EF 25-30%.  . Obesity   . Sleep apnea   . Tobacco use    Past Surgical History:  Procedure Laterality Date  . CARDIAC CATHETERIZATION N/A 03/02/2016   Procedure: Right/Left Heart Cath and Coronary Angiography;  Surgeon: Iran OuchMuhammad A Arida, MD;  Location: [facility] INVASIVE CV LAB;  Service: Cardiovascular;  Laterality: N/A;    Allergies  No Known Allergies  History of Present Illness    40 y/o ? with a h/o non-ischemic/hypertensive cardiomyopathy, DM II, OSA, obesity, etoh/tob abuse, and chronic systolic chf.  H/o cardiomyopathy dates back to at least 2015 at which time he underwent stress testing, which was non-ischemic but revealed LV dysfxn with an EF of 40%.  More recently, [facility] with CHF requiring IV diuresis.  Echo was initially read as showing an EF of 45-50% however subsequent review by Dr. Kirke CorinArida revealed an EF of 25-30%.  This was followed by R and L heart cath, revealing nl cors with elevated filling pressures, nl CO, and an EF of 25-30%.  He was placed on  blocker and entresto and subsequently d/c'd.  He has since f/u in late July, @ which time he was doing well an entresto was titrated further.   Since his visit in July,   Home Medications    Prior to Admission medications     Medication Sig Start Date End Date Taking? Authorizing Provider  albuterol (PROVENTIL HFA) 108 (90 BASE) MCG/ACT inhaler Inhale 2 puffs into the lungs every 4 (four) hours as needed for wheezing or shortness of breath. 08/14/15   Sharman CheekPhillip Stafford, MD  Ascorbic Acid (VITAMIN C PO) Take 1 tablet by mouth daily.    Historical Provider, MD  carvedilol (COREG) 25 MG tablet Take 1 tablet (25 mg total) by mouth 2 (two) times daily. 01/15/16   Shaune PollackQing Chen, MD  furosemide (LASIX) 40 MG tablet Take 1 tablet (40 mg total) by mouth daily. Patient taking differently: Take 40 mg by mouth 2 (two) times daily.  01/15/16   Shaune PollackQing Chen, MD  lovastatin (MEVACOR) 10 MG tablet Take 10 mg by mouth at bedtime.    Historical Provider, MD  metFORMIN (GLUCOPHAGE) 500 MG tablet Take 1 tablet (500 mg total) by mouth 2 (two) times daily with a meal. 08/14/15   Sharman CheekPhillip Stafford, MD  sacubitril-valsartan (ENTRESTO) 49-51 MG Take 1 tablet by mouth 2 (two) times daily. 03/20/16   Iran OuchMuhammad A Arida, MD    Review of Systems    ***.  All other systems reviewed and are otherwise negative except as noted above.  Physical Exam    VS:  There were no vitals taken for this visit. , BMI  There is no height or weight on file to calculate BMI. GEN: Well nourished, well developed, in no acute distress.  HEENT: normal.  Neck: Supple, no JVD, carotid bruits, or masses. Cardiac: RRR, no murmurs, rubs, or gallops. No clubbing, cyanosis, edema.  Radials/DP/PT 2+ and equal bilaterally.  Respiratory:  Respirations regular and unlabored, clear to auscultation bilaterally. GI: Soft, nontender, nondistended, BS + x 4. MS: no deformity or atrophy. Skin: warm and dry, no rash. Neuro:  Strength and sensation are intact. Psych: Normal affect.  Accessory Clinical Findings    ECG - ***  Assessment & Plan    1.  ***   Nicolasa Ducking, NP 04/16/2016, 12:15 PM

## 2016-05-08 ENCOUNTER — Ambulatory Visit (INDEPENDENT_AMBULATORY_CARE_PROVIDER_SITE_OTHER): Payer: Medicaid Other | Admitting: Nurse Practitioner

## 2016-05-08 ENCOUNTER — Other Ambulatory Visit: Payer: Self-pay

## 2016-05-08 ENCOUNTER — Encounter: Payer: Self-pay | Admitting: Nurse Practitioner

## 2016-05-08 VITALS — BP 150/90 | HR 79 | Temp 98.5°F | Ht 70.0 in | Wt 250.2 lb

## 2016-05-08 DIAGNOSIS — I5023 Acute on chronic systolic (congestive) heart failure: Secondary | ICD-10-CM | POA: Diagnosis not present

## 2016-05-08 DIAGNOSIS — I5022 Chronic systolic (congestive) heart failure: Secondary | ICD-10-CM | POA: Diagnosis not present

## 2016-05-08 DIAGNOSIS — I110 Hypertensive heart disease with heart failure: Secondary | ICD-10-CM

## 2016-05-08 DIAGNOSIS — F1010 Alcohol abuse, uncomplicated: Secondary | ICD-10-CM | POA: Diagnosis not present

## 2016-05-08 DIAGNOSIS — E669 Obesity, unspecified: Secondary | ICD-10-CM | POA: Insufficient documentation

## 2016-05-08 DIAGNOSIS — I429 Cardiomyopathy, unspecified: Secondary | ICD-10-CM

## 2016-05-08 DIAGNOSIS — I428 Other cardiomyopathies: Secondary | ICD-10-CM | POA: Insufficient documentation

## 2016-05-08 DIAGNOSIS — E119 Type 2 diabetes mellitus without complications: Secondary | ICD-10-CM | POA: Insufficient documentation

## 2016-05-08 MED ORDER — SACUBITRIL-VALSARTAN 97-103 MG PO TABS: 1.0000 | ORAL_TABLET | Freq: Two times a day (BID) | ORAL | 3 refills | Status: DC

## 2016-05-08 NOTE — Progress Notes (Signed)
Office Visit    Patient Name: Jack Davidson Date of Encounter: 05/08/2016  Primary Care Provider:  Sherrie Mustache, MD Primary Cardiologist:  Judie Petit. Kirke Corin, MD   Chief Complaint    40 year old male with a history of nonischemic cardiomyopathy who presents for follow-up.  Past Medical History    Past Medical History:  Diagnosis Date  . Chronic systolic CHF (congestive heart failure) (HCC)    a. 12/2015 Echo: EF 25-30%, sev dil LV.  . Diabetes mellitus without complication (HCC)   . ETOH abuse   . Hyperlipidemia   . Hypertensive heart disease   . NICM (nonischemic cardiomyopathy) (HCC)    a. 2015 Neg MV, EF 40%;  b. 12/2015 Echo: EF 25-30% sev dil LV;  c. 12/2015 Cath: nl cors, mild PAH, CO 6.7L/m, EF 25-30%.  . Obesity   . Sleep apnea   . Tobacco use    Past Surgical History:  Procedure Laterality Date  . CARDIAC CATHETERIZATION N/A 03/02/2016   Procedure: Right/Left Heart Cath and Coronary Angiography;  Surgeon: Iran Ouch, MD;  Location: [facility] INVASIVE CV LAB;  Service: Cardiovascular;  Laterality: N/A;    Allergies  No Known Allergies  History of Present Illness    40 year old male with a prior history of nonischemic cardiomyopathy, hypertension, hyperlipidemia, EtOH abuse, diabetes, obesity, tobacco abuse, and sleep apnea. He had a prior nonischemic Myoview in 2015, followed by an echo in May 2017 with an EF of 25-30%. This was followed by catheterization revealing normal coronary arteries. He was last seen in clinic in July at which time, his Entresto dose was titrated with recommendation for follow-up in 2 weeks with basic metabolic panel and further titration of Entresto. He missed that appointment. Since his last visit, he has done reasonably well. He continues to work full-time at [employer], where he works in [employer]. He has not been experiencing significant dyspnea on exertion, PND, orthopnea, dizziness, syncope, edema, or early satiety. He does have  some abdominal fullness from time to time, which would prompt him to take an additional Lasix. He says he does this many days per week. He does not weigh himself at home. He says that he watches salt in his diet and prepares his own meals but then notes that he eats vegetables from [place] and today had 2 large slices of meat lover's pizza. He is mostly compliant with medications, noting that sometimes he misses the evening doses of his carvedilol and Entresto. Over the past few days, he has been struggling with cold symptoms and congestion. He knows that he needs to be careful with over-the-counter medications.  Home Medications    Prior to Admission medications   Medication Sig Start Date End Date Taking? Authorizing Provider  albuterol (PROVENTIL HFA) 108 (90 BASE) MCG/ACT inhaler Inhale 2 puffs into the lungs every 4 (four) hours as needed for wheezing or shortness of breath. 08/14/15  Yes Sharman Cheek, MD  Ascorbic Acid (VITAMIN C PO) Take 1 tablet by mouth daily.   Yes Historical Provider, MD  carvedilol (COREG) 25 MG tablet Take 1 tablet (25 mg total) by mouth 2 (two) times daily. 01/15/16  Yes Shaune Pollack, MD  furosemide (LASIX) 40 MG tablet Take 1 tablet (40 mg total) by mouth daily. Patient taking differently: Take 40 mg by mouth 2 (two) times daily.  01/15/16  Yes Shaune Pollack, MD  lovastatin (MEVACOR) 10 MG tablet Take 10 mg by mouth at bedtime.   Yes Historical Provider, MD  metFORMIN (GLUCOPHAGE) 500 MG tablet Take 1 tablet (500 mg total) by mouth 2 (two) times daily with a meal. 08/14/15  Yes Sharman CheekPhillip Stafford, MD  sacubitril-valsartan (ENTRESTO) 97-103 MG Take 1 tablet by mouth 2 (two) times daily. 05/08/16   Ok Anishristopher R Samora Jernberg, NP    Review of Systems    As above, he occasionally notes abdominal fullness which he attributes to fluid retention. He denies chest pain, dyspnea, PND, orthopnea, dizziness, syncope, or edema.  All other systems reviewed and are otherwise negative except as  noted above.  Physical Exam    VS:  BP (!) 150/90 (BP Location: Left Arm, Patient Position: Sitting, Cuff Size: Large)   Pulse 79   Temp 98.5 F (36.9 C)   Ht 5\' 10"  (1.778 m)   Wt 250 lb 4 oz (113.5 kg)   BMI 35.91 kg/m  , BMI Body mass index is 35.91 kg/m. GEN: Well nourished, well developed, in no acute distress.  HEENT: normal.  Neck: Supple, no JVD, carotid bruits, or masses. Cardiac: RRR, no murmurs, rubs, or gallops. No clubbing, cyanosis, edema.  Radials/DP/PT 2+ and equal bilaterally.  Respiratory:  Respirations regular and unlabored, Scattered rhonchi. GI: Soft, nontender, nondistended, BS + x 4. MS: no deformity or atrophy. Skin: warm and dry, no rash. Neuro:  Strength and sensation are intact. Psych: Normal affect.  Accessory Clinical Findings    ECG -Regular sinus rhythm, 79, LVH with nonspecific ST changes.  Assessment & Plan    1.  Nonischemic cardiomyopathy/chronic systolic congestive heart failure: Patient has been doing reasonably well from this standpoint. He does note abdominal bloating from time to time, which he attributes to excess fluid intake and volume excess. When he feels this, he takes an additional Lasix. He does not weigh himself. He reports relative compliance with his medications and says that sometimes he misses his evening doses of carvedilol and/or Entresto.  We discussed the importance of daily weights, sodium restriction, medication compliance, and symptom reporting and he verbalizes understanding.  He does not own a scale but says he will get one. His blood pressure is elevated today and I will titrate his Entresto to 97/103 mg. I will check a basic metabolic panel today. I will bring him back in one week to repeat a basic metabolic panel. Provided that things are stable, we'll plan to bring him back in one month at which point, we can consider the addition of spironolactone. At some point, once he is on stable doses of maximum medical therapy and  he is able to show compliance, we'll need to reevaluate his LV function and determine candidacy for ICD therapy.  2. Hypertensive heart disease: Blood pressure is elevated today. I am titrating his Entresto.  3. EtOH and tobacco abuse: Complete cessation advised. He says he has cut down.  4. Sleep apnea: He is not using CPAP and per prior notes, he previously gave away his device.  5. DM II:  He is on metformin.  Followed by PCP.  6.  Disposition: Follow-up basic metabolic panel today and in one week in follow-up clinic in one month.  Nicolasa Duckinghristopher Dylin Breeden, NP 05/08/2016, 3:56 PM

## 2016-05-08 NOTE — Patient Instructions (Addendum)
Medication Instructions:  Your physician has recommended you make the following change in your medication:  INCREASE entresto to 97/103mg  twice daily   Labwork: BMET today BMET in one week  Testing/Procedures: none  Follow-Up: Your physician recommends that you schedule a follow-up appointment in: one month with Eula Listen, PA-C or Dr. Kirke Corin.    Any Other Special Instructions Will Be Listed Below (If Applicable).     If you need a refill on your cardiac medications before your next appointment, please call your pharmacy.

## 2016-05-09 LAB — BASIC METABOLIC PANEL
BUN/Creatinine Ratio: 10 (ref 9–20)
BUN: 11 mg/dL (ref 6–24)
CALCIUM: 9.3 mg/dL (ref 8.7–10.2)
CHLORIDE: 101 mmol/L (ref 96–106)
CO2: 28 mmol/L (ref 18–29)
Creatinine, Ser: 1.13 mg/dL (ref 0.76–1.27)
GFR calc non Af Amer: 80 mL/min/{1.73_m2} (ref >59)
GFR, EST AFRICAN AMERICAN: 93 mL/min/{1.73_m2} (ref >59)
GLUCOSE: 104 mg/dL — AB (ref 65–99)
Potassium: 3.7 mmol/L (ref 3.5–5.2)
Sodium: 142 mmol/L (ref 134–144)

## 2016-05-19 NOTE — Telephone Encounter (Signed)
This encounter was created in error - please disregard.  This encounter was created in error - please disregard.  This encounter was created in error - please disregard.  This encounter was created in error - please disregard.  This encounter was created in error - please disregard.  This encounter was created in error - please disregard.

## 2016-06-02 ENCOUNTER — Telehealth: Payer: Self-pay | Admitting: Cardiovascular Disease

## 2016-06-02 NOTE — Telephone Encounter (Signed)
Pt did not have f/u labs @ [facility] in Sept. Left detailed message of need for f/u labs w/CB number on pt cell VM.

## 2016-06-09 ENCOUNTER — Ambulatory Visit (INDEPENDENT_AMBULATORY_CARE_PROVIDER_SITE_OTHER): Payer: Medicaid Other | Admitting: Cardiovascular Disease

## 2016-06-09 ENCOUNTER — Encounter: Payer: Self-pay | Admitting: Cardiovascular Disease

## 2016-06-09 VITALS — BP 160/100 | HR 65 | Ht 70.0 in | Wt 247.0 lb

## 2016-06-09 DIAGNOSIS — I10 Essential (primary) hypertension: Secondary | ICD-10-CM | POA: Diagnosis not present

## 2016-06-09 DIAGNOSIS — E785 Hyperlipidemia, unspecified: Secondary | ICD-10-CM | POA: Diagnosis not present

## 2016-06-09 DIAGNOSIS — I5022 Chronic systolic (congestive) heart failure: Secondary | ICD-10-CM | POA: Diagnosis not present

## 2016-06-09 MED ORDER — SACUBITRIL-VALSARTAN 49-51 MG PO TABS: 1.0000 | ORAL_TABLET | Freq: Two times a day (BID) | ORAL | 1 refills | Status: DC

## 2016-06-09 NOTE — Progress Notes (Signed)
Cardiology Office Note   Date:  06/09/2016   ID:  Jack Davidson, DOB 08-Dec-1975, MRN [id number]  PCP:  Sherrie Mustache, MD  Cardiologist:   Lorine Bears, MD   Chief Complaint  Patient presents with  . Congestive Heart Failure  . Hypertension  . Hyperlipidemia      History of Present Illness: Jack Davidson is a 40 y.o. 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 He has known history of hypertension which has been uncontrolled, type 2 diabetes, obesity, sleep apnea not on CPAP and hyperlipidemia. Previous nuclear stress test in November 2015 showed no evidence of perfusion defects with ejection fraction of 40%.   He was hospitalized in May 2017 with acute on chronic systolic heart failure. Echocardiogram showed an ejection fraction of 25-30% with global hypokinesis.  A right and left cardiac cath was done in July 2017 which showed normal coronary arteries, severely reduced LV systolic function with an ejection fraction of 25-30% with severely dilated left ventricle. Right heart catheterization showed moderately elevated filling pressures with pulmonary capillary wedge pressure of 20 mmHg, mild pulmonary hypertension and normal cardiac output at 6.7 L/m.  The patient had poor compliance with medications and he has not been taking Entresto on a regular basis. He denies any chest pain or shortness of breath.    Past Medical History:  Diagnosis Date  . Chronic systolic CHF (congestive heart failure) (HCC)    a. 12/2015 Echo: EF 25-30%, sev dil LV.  . Diabetes mellitus without complication (HCC)   . ETOH abuse   . Hyperlipidemia   . Hypertensive heart disease   . NICM (nonischemic cardiomyopathy) (HCC)    a. 2015 Neg MV, EF 40%;  b. 12/2015 Echo: EF 25-30% sev dil LV;  c. 12/2015 Cath: nl cors, mild PAH, CO 6.7L/m, EF 25-30%.  . Obesity   . Sleep apnea   . Tobacco use     Past Surgical History:    Procedure Laterality Date  . CARDIAC CATHETERIZATION N/A 03/02/2016   Procedure: Right/Left Heart Cath and Coronary Angiography;  Surgeon: Iran Ouch, MD;  Location: [facility] INVASIVE CV LAB;  Service: Cardiovascular;  Laterality: N/A;     Current Outpatient Prescriptions  Medication Sig Dispense Refill  . albuterol (PROVENTIL HFA) 108 (90 BASE) MCG/ACT inhaler Inhale 2 puffs into the lungs every 4 (four) hours as needed for wheezing or shortness of breath. 1 Inhaler 0  . Ascorbic Acid (VITAMIN C PO) Take 1 tablet by mouth daily.    . carvedilol (COREG) 25 MG tablet Take 1 tablet (25 mg total) by mouth 2 (two) times daily. 60 tablet 1  . furosemide (LASIX) 10 MG/ML solution Take by mouth daily.    . furosemide (LASIX) 40 MG tablet Take 40 mg by mouth 2 (two) times daily.    Marland Kitchen lovastatin (MEVACOR) 10 MG tablet Take 10 mg by mouth at bedtime.    . metFORMIN (GLUCOPHAGE) 500 MG tablet Take 1 tablet (500 mg total) by mouth 2 (two) times daily with a meal. 60 tablet 1  . sacubitril-valsartan (ENTRESTO) 97-103 MG Take 1 tablet by mouth 2 (two) times daily. 60 tablet 3   No current facility-administered medications for this visit.     Allergies:   Review of patient's allergies indicates no known allergies.    Social History:  The patient  reports that he has been smoking Cigarettes.  He has been smoking about 0.50 packs per  day. He has never used smokeless tobacco. He reports that he drinks alcohol. He reports that he does not use drugs.   Family History:  The patient's family history includes Diabetes in his mother.    ROS:  Please see the history of present illness.   Otherwise, review of systems are positive for none.   All other systems are reviewed and negative.    PHYSICAL EXAM: VS:  BP (!) 160/100   Pulse 65   Ht 5\' 10"  (1.778 m)   Wt 247 lb (112 kg)   SpO2 94%   BMI 35.44 kg/m  , BMI Body mass index is 35.44 kg/m. GEN: Well nourished, well developed, in no acute distress   HEENT: normal  Neck: no JVD, carotid bruits, or masses Cardiac: RRR; no murmurs, rubs, or gallops,no edema  Respiratory:  clear to auscultation bilaterally, normal work of breathing GI: soft, nontender, nondistended, + BS MS: no deformity or atrophy  Skin: warm and dry, no rash Neuro:  Strength and sensation are intact Psych: euthymic mood, full affect Right radial pulse is normal with no hematoma.  EKG:  EKG is not ordered today.    Recent Labs: 01/15/2016: Magnesium 2.0 01/27/2016: ALT 101; B Natriuretic Peptide 403.0; Hemoglobin 13.1 02/21/2016: Platelets 179 05/08/2016: BUN 11; Creatinine, Ser 1.13; Potassium 3.7; Sodium 142    Lipid Panel No results found for: CHOL, TRIG, HDL, CHOLHDL, VLDL, LDLCALC, LDLDIRECT    Wt Readings from Last 3 Encounters:  06/09/16 247 lb (112 kg)  05/08/16 250 lb 4 oz (113.5 kg)  03/20/16 243 lb 12 oz (110.6 kg)       ASSESSMENT AND PLAN:  1.  Chronic systolic heart failure: Due to nonischemic cardiomyopathy with ejection fraction of 25-30%. This is likely due to hypertensive heart disease or alcohol induced cardiomyopathy.  He denies excessive alcohol use. He has not been taking his heart failure medications regularly. He reports being busy at work. I had a prolonged discussion with him about the seriousness of his heart situation. I strongly advised him to take his medications regularly. He initially told me that he was not taking Entresto at all. He then mentioned that he has been taking it but he did not get the new dose. His compliance has been a big issue. He promised to take his medications regularly and I have kept him on the medium dose of Entresto.  2. Essential hypertension: Blood pressure is elevated due to poor compliance.  3. Excessive alcohol use: he reports cutting down but I'm not sure he is being straightforward with this.  4. Sleep apnea: The patient has been refusing to use CPAP and he actually gave away his  device.    Disposition:   FU with me in 1 month  Signed,  Lorine BearsMuhammad Arida, MD  06/09/2016 3:52 PM    [facility]

## 2016-06-09 NOTE — Patient Instructions (Signed)
Medication Instructions:  Your physician has recommended you make the following change in your medication:  STOP taking entresto 97-103mg  START taking entresto 49-51mg  twice daily   Labwork: none  Testing/Procedures: none  Follow-Up: Your physician recommends that you schedule a follow-up appointment in: one month with Dr. Kirke Corin.    Any Other Special Instructions Will Be Listed Below (If Applicable).     If you need a refill on your cardiac medications before your next appointment, please call your pharmacy.

## 2016-07-14 ENCOUNTER — Ambulatory Visit: Payer: Medicaid Other | Admitting: Cardiovascular Disease

## 2016-07-14 ENCOUNTER — Encounter: Payer: Self-pay | Admitting: *Deleted

## 2016-07-28 ENCOUNTER — Other Ambulatory Visit: Payer: Self-pay

## 2016-07-28 MED ORDER — CARVEDILOL 25 MG PO TABS: 25.0000 mg | ORAL_TABLET | Freq: Two times a day (BID) | ORAL | 3 refills | Status: AC

## 2016-07-28 NOTE — Telephone Encounter (Signed)
Requested Prescriptions   Signed Prescriptions Disp Refills  . carvedilol (COREG) 25 MG tablet 60 tablet 3    Sig: Take 1 tablet (25 mg total) by mouth 2 (two) times daily.    Authorizing Provider: Lorine Bears A    Ordering User: Iverson Alamin C      Pt requesting refill on Furosemide 10 mg solution ok to refill?

## 2016-07-29 ENCOUNTER — Telehealth: Payer: Self-pay | Admitting: Cardiovascular Disease

## 2016-07-29 NOTE — Telephone Encounter (Addendum)
Pt requested lasix refill. Medication list has lasix 40mg  and lasix 10mg  listed, neither of which Dr. Kirke Corin prescribed. He is unsure which dosage he is taking. Pt missed Nov f/u appt. I have advised pt to call back tomorrow to report which he is taking and to schedule f/u appt. I will then review with Dr. Kirke Corin for refill advice.  Pt agreeable w/plan w/no further questions.

## 2017-09-18 IMAGING — DX DG CHEST 1V PORT
1 series · 1 of 1 positions shown · non-contrast
Comparison: Portable exam 4474 hours compared to 08/14/2015

CLINICAL DATA: Patient states he needs air, unable to catch his
breath, shortness of breath in the evenings generally but increased
last night, unable to sleep, hypertension, diabetes mellitus, smoker

EXAM:
PORTABLE CHEST 1 VIEW

[chest ap]
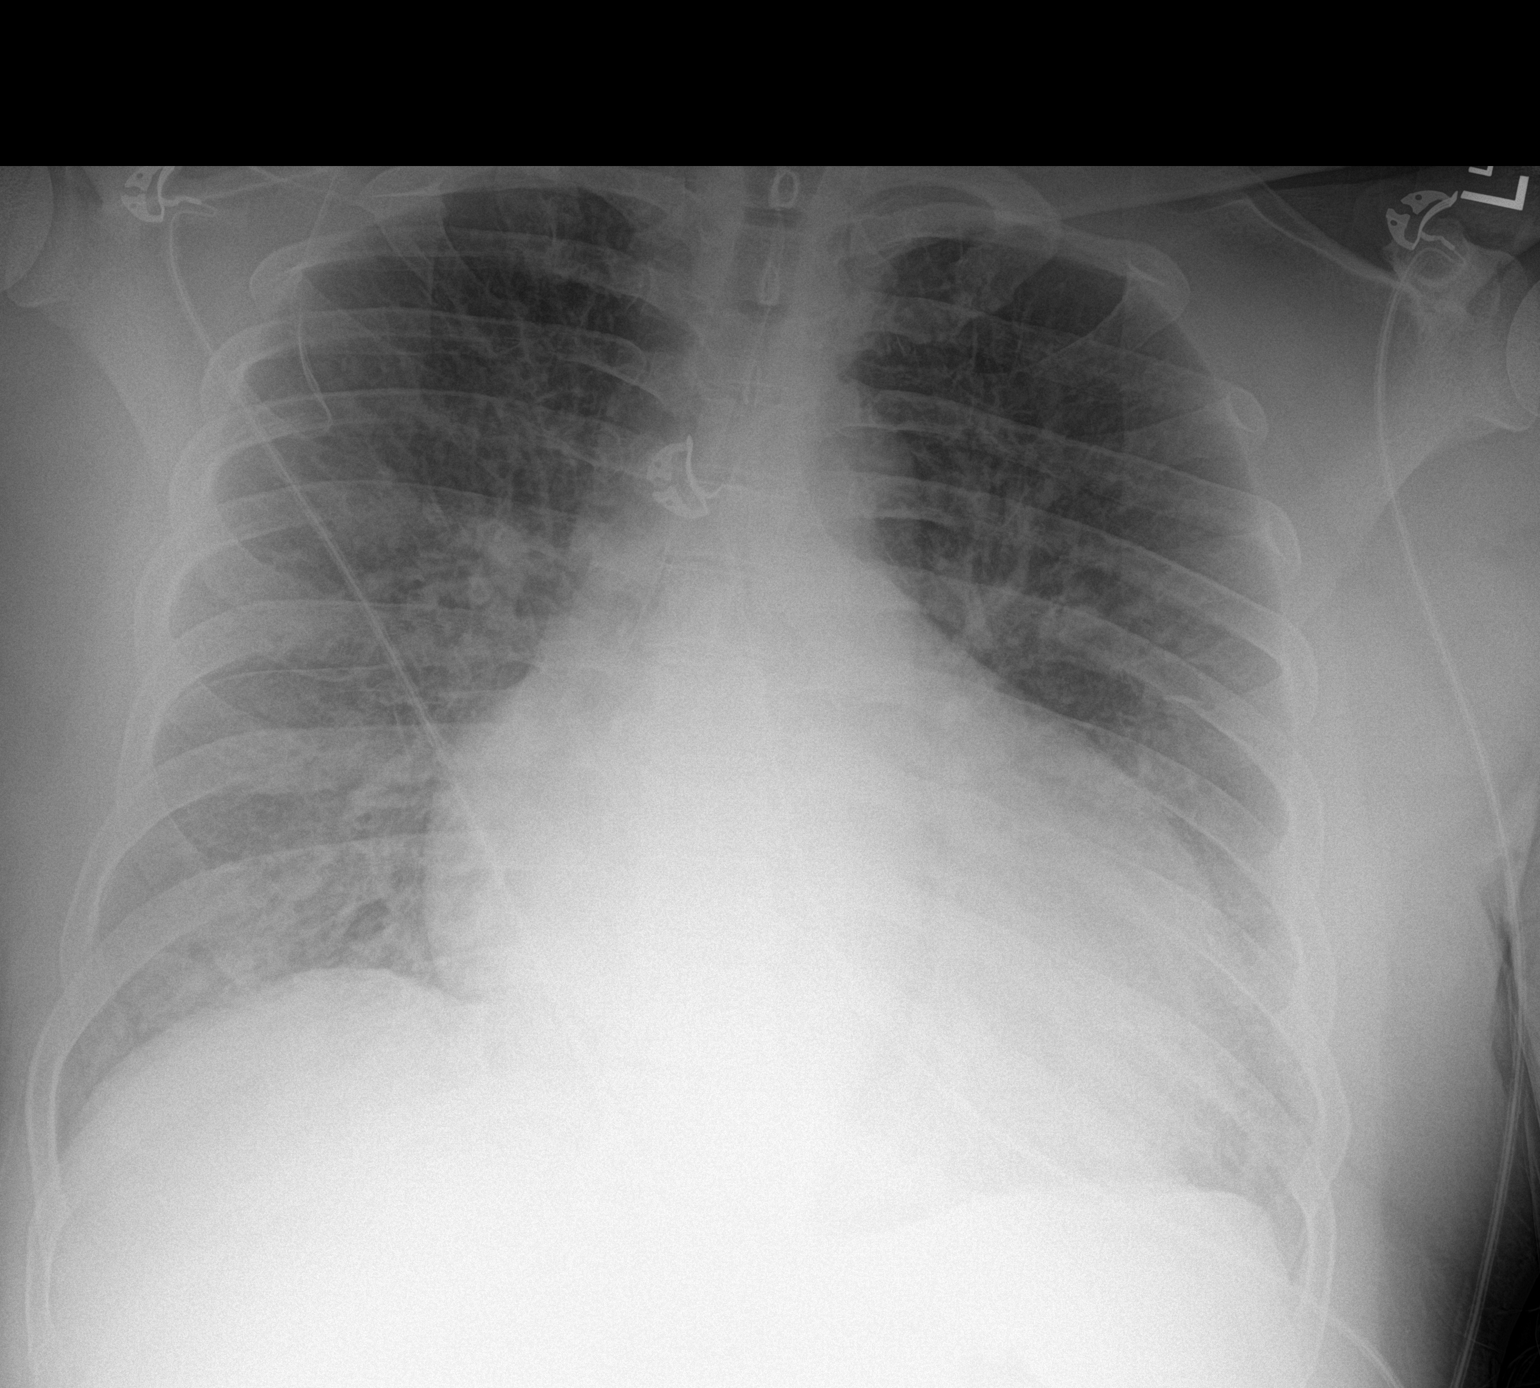

[1 of 1 positions shown; findings below may reference images not displayed]

FINDINGS: Enlargement of cardiac silhouette with pulmonary vascular
congestion.

Mild elongation of thoracic aorta.

Slight chronic accentuation of perihilar markings stable.

Mild RIGHT basilar atelectasis.

Remaining lungs clear.

No definite pleural effusion or pneumothorax.
IMPRESSION: Enlargement of cardiac silhouette with pulmonary vascular
congestion.

Chronic accentuation of pulmonary markings with mild RIGHT basilar
atelectasis but no definite acute infiltrate.

## 2017-10-01 IMAGING — CR DG CHEST 2V
1 series · 2 of 2 positions shown · non-contrast
Comparison: 11/17/2012, 01/15/2016

CLINICAL DATA: Cough with yellow sputum, shortness of breath, fever
for 1 week

EXAM:
CHEST  2 VIEW

[Series 1: dg chest 2 view · 0.14mm/px · 2 of 2 slices shown]
[im 1/2]
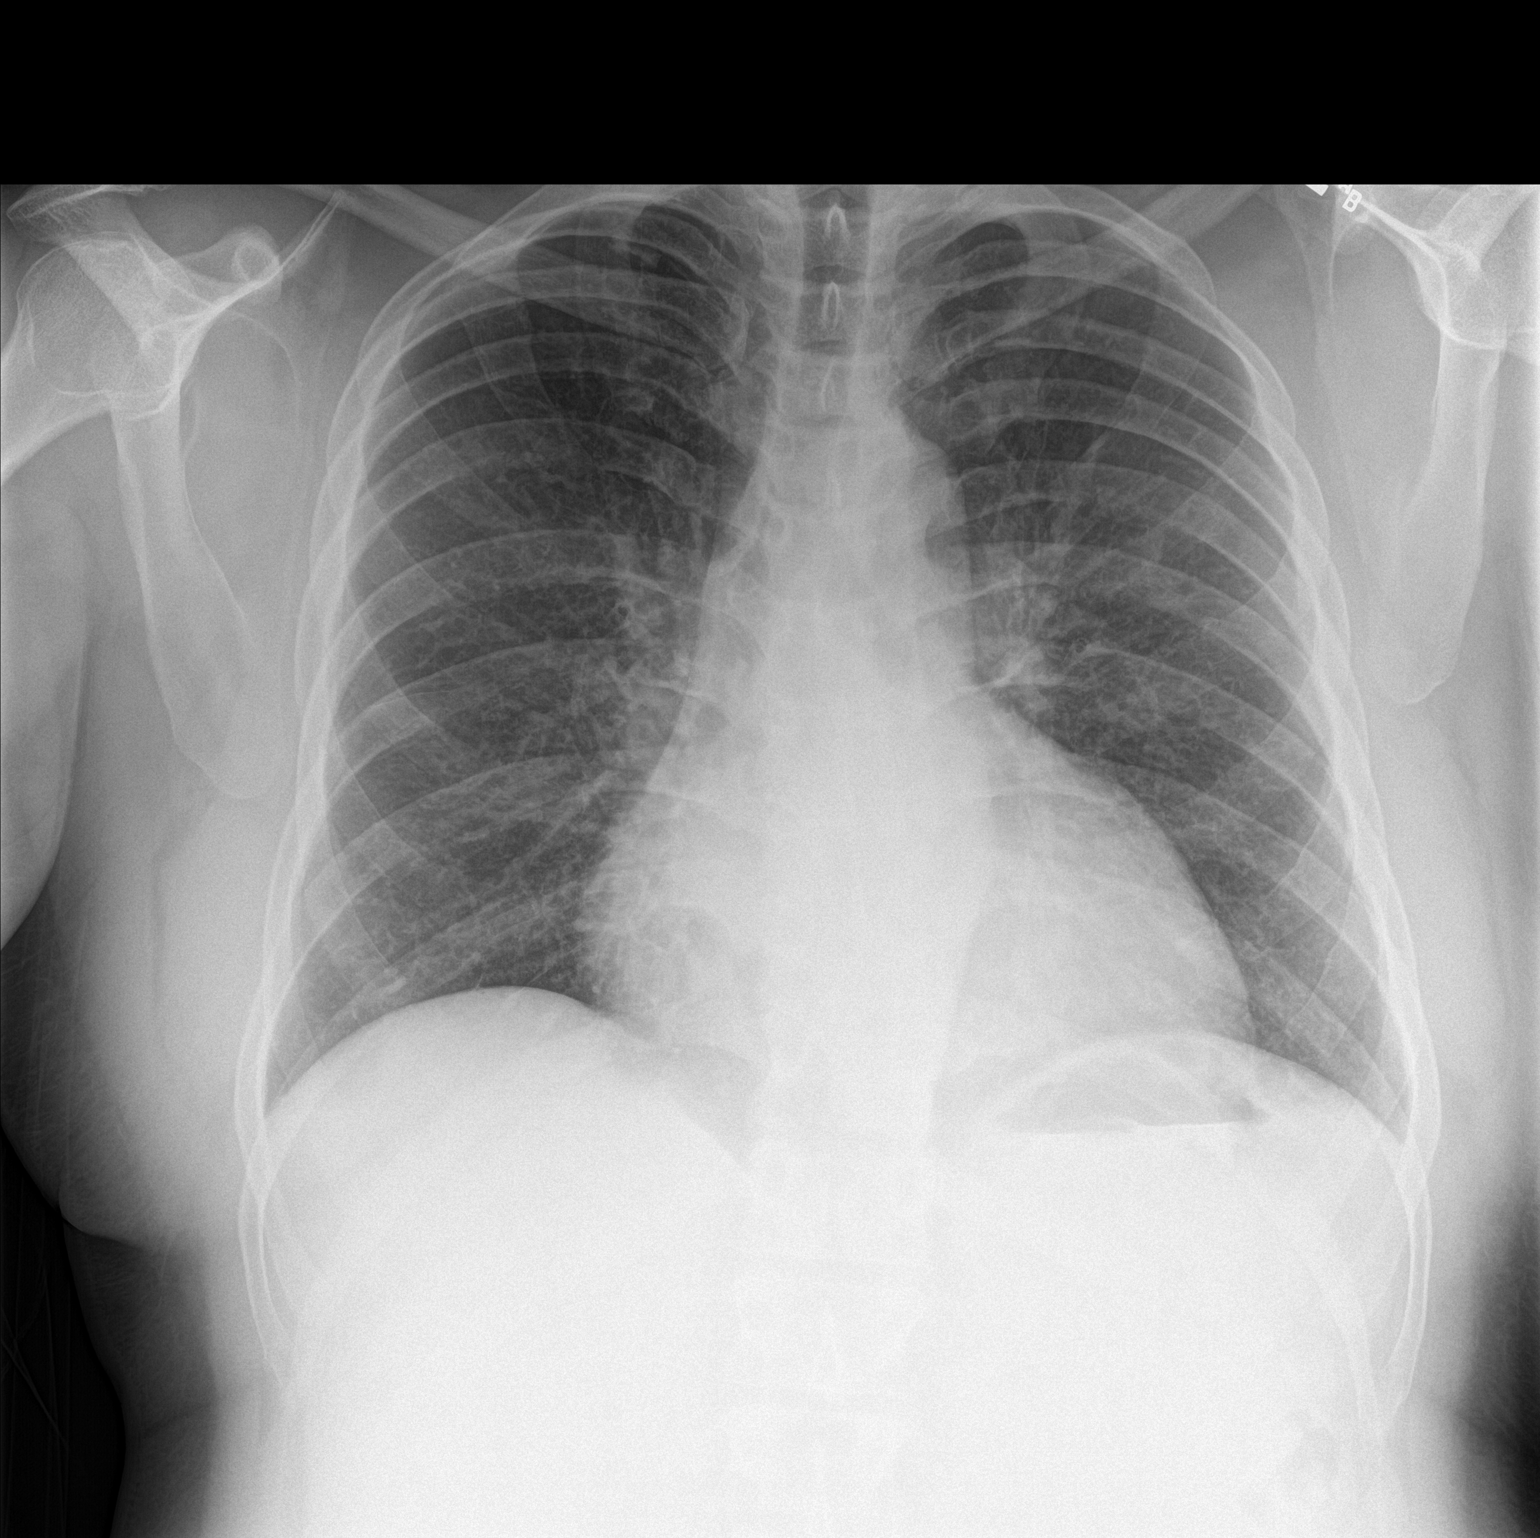
[im 2/2]
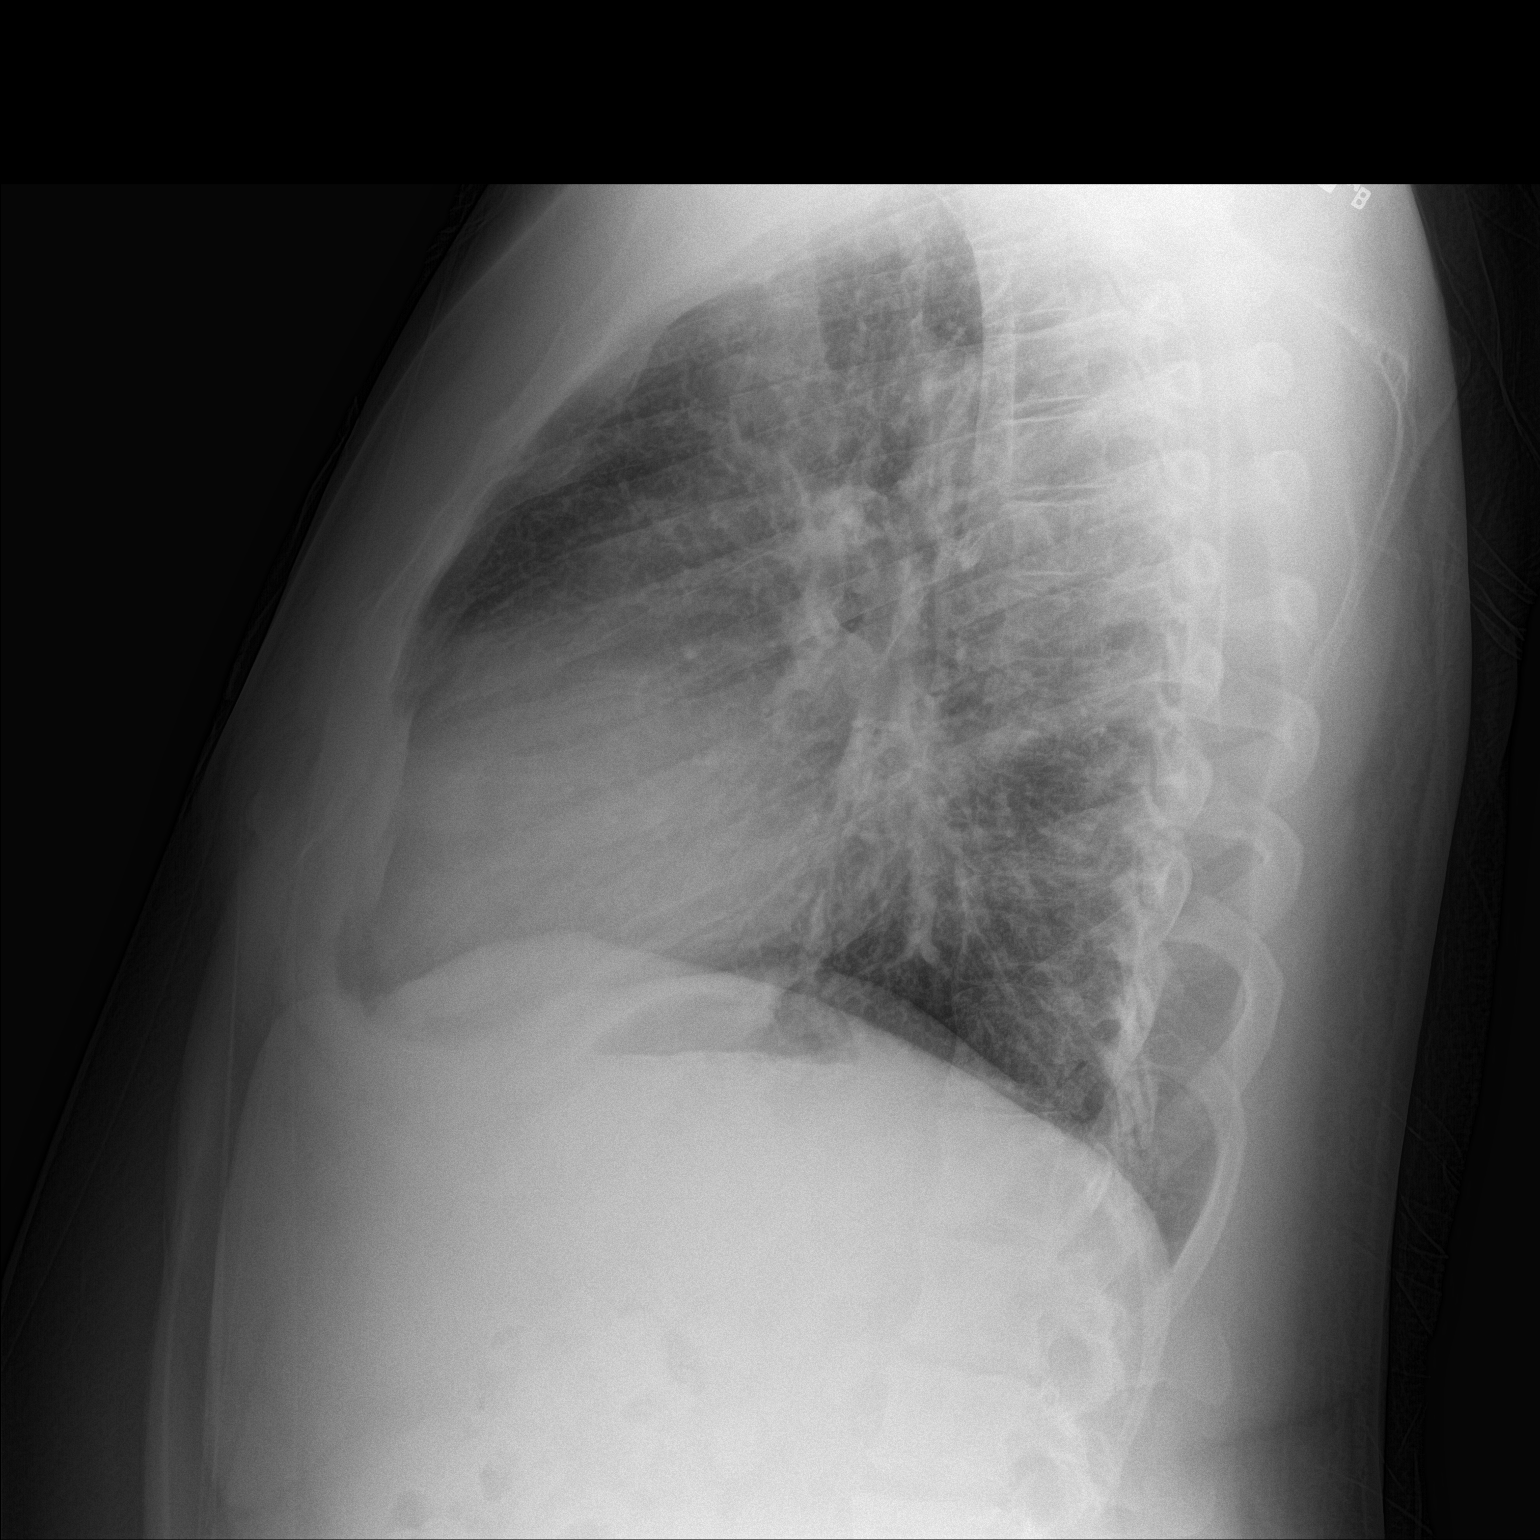

[2 of 2 positions shown; findings below may reference images not displayed]

FINDINGS: There is no focal parenchymal opacity. There is no pleural effusion
or pneumothorax. There is mild stable cardiomegaly.

The osseous structures are unremarkable.
IMPRESSION: No active cardiopulmonary disease.

## 2017-10-20 ENCOUNTER — Other Ambulatory Visit: Payer: Self-pay

## 2017-10-20 ENCOUNTER — Encounter: Payer: Self-pay | Admitting: Emergency Medicine

## 2017-10-20 ENCOUNTER — Emergency Department: Payer: Self-pay

## 2017-10-20 ENCOUNTER — Inpatient Hospital Stay
Admission: EM | Admit: 2017-10-20 | Discharge: 2017-10-22 | DRG: 293 | Disposition: A | Discharge status: Home / Self Care | Payer: Self-pay | Attending: Specialist | Admitting: Specialist

## 2017-10-20 DIAGNOSIS — I5043 Acute on chronic combined systolic (congestive) and diastolic (congestive) heart failure: Secondary | ICD-10-CM | POA: Diagnosis present

## 2017-10-20 DIAGNOSIS — E669 Obesity, unspecified: Secondary | ICD-10-CM | POA: Diagnosis present

## 2017-10-20 DIAGNOSIS — Z6834 Body mass index (BMI) 34.0-34.9, adult: Secondary | ICD-10-CM

## 2017-10-20 DIAGNOSIS — E119 Type 2 diabetes mellitus without complications: Secondary | ICD-10-CM | POA: Diagnosis present

## 2017-10-20 DIAGNOSIS — Z9119 Patient's noncompliance with other medical treatment and regimen: Secondary | ICD-10-CM

## 2017-10-20 DIAGNOSIS — G4733 Obstructive sleep apnea (adult) (pediatric): Secondary | ICD-10-CM | POA: Diagnosis present

## 2017-10-20 DIAGNOSIS — E785 Hyperlipidemia, unspecified: Secondary | ICD-10-CM | POA: Diagnosis present

## 2017-10-20 DIAGNOSIS — I5023 Acute on chronic systolic (congestive) heart failure: Secondary | ICD-10-CM | POA: Diagnosis present

## 2017-10-20 DIAGNOSIS — F17210 Nicotine dependence, cigarettes, uncomplicated: Secondary | ICD-10-CM | POA: Diagnosis present

## 2017-10-20 DIAGNOSIS — F1010 Alcohol abuse, uncomplicated: Secondary | ICD-10-CM | POA: Diagnosis present

## 2017-10-20 DIAGNOSIS — I110 Hypertensive heart disease with heart failure: Principal | ICD-10-CM | POA: Diagnosis present

## 2017-10-20 DIAGNOSIS — F17200 Nicotine dependence, unspecified, uncomplicated: Secondary | ICD-10-CM

## 2017-10-20 DIAGNOSIS — I255 Ischemic cardiomyopathy: Secondary | ICD-10-CM | POA: Diagnosis present

## 2017-10-20 DIAGNOSIS — I509 Heart failure, unspecified: Secondary | ICD-10-CM

## 2017-10-20 DIAGNOSIS — Z9114 Patient's other noncompliance with medication regimen: Secondary | ICD-10-CM

## 2017-10-20 DIAGNOSIS — E876 Hypokalemia: Secondary | ICD-10-CM | POA: Diagnosis present

## 2017-10-20 DIAGNOSIS — Z833 Family history of diabetes mellitus: Secondary | ICD-10-CM

## 2017-10-20 DIAGNOSIS — Z7984 Long term (current) use of oral hypoglycemic drugs: Secondary | ICD-10-CM

## 2017-10-20 LAB — BASIC METABOLIC PANEL
ANION GAP: 10 (ref 5–15)
BUN: 19 mg/dL (ref 6–20)
CHLORIDE: 107 mmol/L (ref 101–111)
CO2: 23 mmol/L (ref 22–32)
Calcium: 8.9 mg/dL (ref 8.9–10.3)
Creatinine, Ser: 1.14 mg/dL (ref 0.61–1.24)
GFR calc Af Amer: >60 mL/min (ref >60)
Glucose, Bld: 133 mg/dL — ABNORMAL HIGH (ref 65–99)
POTASSIUM: 3.5 mmol/L (ref 3.5–5.1)
SODIUM: 140 mmol/L (ref 135–145)

## 2017-10-20 LAB — CBC
HEMATOCRIT: 35.5 % — AB (ref 40.0–52.0)
HEMOGLOBIN: 12.1 g/dL — AB (ref 13.0–18.0)
MCH: 31.9 pg (ref 26.0–34.0)
MCHC: 34.1 g/dL (ref 32.0–36.0)
MCV: 93.3 fL (ref 80.0–100.0)
Platelets: 248 10*3/uL (ref 150–440)
RBC: 3.8 MIL/uL — ABNORMAL LOW (ref 4.40–5.90)
RDW: 14.4 % (ref 11.5–14.5)
WBC: 9.7 10*3/uL (ref 3.8–10.6)

## 2017-10-20 LAB — HEMOGLOBIN A1C
Hgb A1c MFr Bld: 6.1 % — ABNORMAL HIGH (ref 4.8–5.6)
Mean Plasma Glucose: 128.37 mg/dL

## 2017-10-20 LAB — GLUCOSE, CAPILLARY
Glucose-Capillary: 116 mg/dL — ABNORMAL HIGH (ref 65–99)
Glucose-Capillary: 151 mg/dL — ABNORMAL HIGH (ref 65–99)
Glucose-Capillary: 156 mg/dL — ABNORMAL HIGH (ref 65–99)

## 2017-10-20 LAB — TSH: TSH: 0.783 u[IU]/mL (ref 0.350–4.500)

## 2017-10-20 LAB — TROPONIN I: Troponin I: 0.03 ng/mL (ref <0.03)

## 2017-10-20 LAB — BRAIN NATRIURETIC PEPTIDE: B NATRIURETIC PEPTIDE 5: 590 pg/mL — AB (ref 0.0–100.0)

## 2017-10-20 MED ORDER — AMLODIPINE BESYLATE 10 MG PO TABS
20.0000 mg | ORAL_TABLET | Freq: Every day | ORAL | Status: DC
  Administered 2017-10-20: 20 mg via ORAL
  Filled 2017-10-20 (×2): qty 2

## 2017-10-20 MED ORDER — ACETAMINOPHEN 650 MG RE SUPP: 650.0000 mg | Freq: Four times a day (QID) | RECTAL | Status: DC | PRN

## 2017-10-20 MED ORDER — INSULIN ASPART 100 UNIT/ML ~~LOC~~ SOLN: 0.0000 [IU] | Freq: Every day | SUBCUTANEOUS | Status: DC

## 2017-10-20 MED ORDER — ONDANSETRON HCL 4 MG/2ML IJ SOLN: 4.0000 mg | Freq: Four times a day (QID) | INTRAMUSCULAR | Status: DC | PRN

## 2017-10-20 MED ORDER — FUROSEMIDE 10 MG/ML IJ SOLN
40.0000 mg | Freq: Once | INTRAMUSCULAR | Status: AC
  Administered 2017-10-20: 40 mg via INTRAVENOUS

## 2017-10-20 MED ORDER — AMLODIPINE BESYLATE 10 MG PO TABS: 10.0000 mg | ORAL_TABLET | Freq: Every day | ORAL | Status: DC

## 2017-10-20 MED ORDER — FUROSEMIDE 40 MG PO TABS: 40.0000 mg | ORAL_TABLET | Freq: Two times a day (BID) | ORAL | Status: DC

## 2017-10-20 MED ORDER — CARVEDILOL 25 MG PO TABS
25.0000 mg | ORAL_TABLET | Freq: Two times a day (BID) | ORAL | Status: DC
  Administered 2017-10-20 – 2017-10-22 (×5): 25 mg via ORAL
  Filled 2017-10-20 (×5): qty 1

## 2017-10-20 MED ORDER — INSULIN ASPART 100 UNIT/ML ~~LOC~~ SOLN
0.0000 [IU] | Freq: Three times a day (TID) | SUBCUTANEOUS | Status: DC
  Administered 2017-10-20 – 2017-10-21 (×2): 2 [IU] via SUBCUTANEOUS
  Administered 2017-10-21: 1 [IU] via SUBCUTANEOUS
  Administered 2017-10-22: 2 [IU] via SUBCUTANEOUS
  Filled 2017-10-20 (×4): qty 1

## 2017-10-20 MED ORDER — LOSARTAN POTASSIUM 25 MG PO TABS
25.0000 mg | ORAL_TABLET | Freq: Every day | ORAL | Status: DC
  Administered 2017-10-20: 25 mg via ORAL
  Filled 2017-10-20: qty 1

## 2017-10-20 MED ORDER — FUROSEMIDE 10 MG/ML IJ SOLN: 40.0000 mg | Freq: Four times a day (QID) | INTRAMUSCULAR | Status: DC

## 2017-10-20 MED ORDER — DOCUSATE SODIUM 100 MG PO CAPS
100.0000 mg | ORAL_CAPSULE | Freq: Two times a day (BID) | ORAL | Status: DC
  Administered 2017-10-20 – 2017-10-21 (×3): 100 mg via ORAL
  Filled 2017-10-20 (×4): qty 1

## 2017-10-20 MED ORDER — FUROSEMIDE 10 MG/ML IJ SOLN
INTRAMUSCULAR | Status: AC
  Filled 2017-10-20: qty 8

## 2017-10-20 MED ORDER — ALBUTEROL SULFATE (2.5 MG/3ML) 0.083% IN NEBU: 2.5000 mg | INHALATION_SOLUTION | RESPIRATORY_TRACT | Status: DC | PRN

## 2017-10-20 MED ORDER — FUROSEMIDE 10 MG/ML IJ SOLN
80.0000 mg | Freq: Two times a day (BID) | INTRAMUSCULAR | Status: DC
Start: 2017-10-20 — End: 2017-10-20
  Administered 2017-10-20: 80 mg via INTRAVENOUS

## 2017-10-20 MED ORDER — FUROSEMIDE 10 MG/ML IJ SOLN
80.0000 mg | Freq: Three times a day (TID) | INTRAMUSCULAR | Status: DC
  Administered 2017-10-20 – 2017-10-22 (×5): 80 mg via INTRAVENOUS
  Filled 2017-10-20 (×5): qty 8

## 2017-10-20 MED ORDER — ACETAMINOPHEN 325 MG PO TABS
650.0000 mg | ORAL_TABLET | Freq: Four times a day (QID) | ORAL | Status: DC | PRN
  Administered 2017-10-20: 650 mg via ORAL
  Filled 2017-10-20: qty 2

## 2017-10-20 MED ORDER — ENOXAPARIN SODIUM 40 MG/0.4ML ~~LOC~~ SOLN
40.0000 mg | SUBCUTANEOUS | Status: DC
  Administered 2017-10-20 – 2017-10-21 (×2): 40 mg via SUBCUTANEOUS
  Filled 2017-10-20 (×2): qty 0.4

## 2017-10-20 MED ORDER — PRAVASTATIN SODIUM 20 MG PO TABS
10.0000 mg | ORAL_TABLET | Freq: Every day | ORAL | Status: DC
  Administered 2017-10-20 – 2017-10-21 (×2): 10 mg via ORAL
  Filled 2017-10-20 (×2): qty 1

## 2017-10-20 MED ORDER — ONDANSETRON HCL 4 MG PO TABS: 4.0000 mg | ORAL_TABLET | Freq: Four times a day (QID) | ORAL | Status: DC | PRN

## 2017-10-20 MED ORDER — FUROSEMIDE 10 MG/ML IJ SOLN
INTRAMUSCULAR | Status: AC
  Filled 2017-10-20: qty 4

## 2017-10-20 NOTE — ED Triage Notes (Signed)
Patient ambulatory to triage with steady gait, without difficulty or distress noted; pt reports SHOB x week, no accomp symptoms

## 2017-10-20 NOTE — Progress Notes (Signed)
[facility] [place] [facility]   PATIENT NAME: Jack Davidson    MR#:  [id number]  DATE OF BIRTH:  [birth year]  SUBJECTIVE:   Patient here due to shortness of breath and noted to be in CHF.  Likely due to medical non-compliance as he has not taken his meds in 2-3 weeks.    REVIEW OF SYSTEMS:    Review of Systems  Constitutional: Negative for fever and weight loss.  HENT: Negative for congestion, nosebleeds and tinnitus.   Eyes: Negative for blurred vision, double vision and redness.  Respiratory: Positive for shortness of breath. Negative for cough and hemoptysis.   Cardiovascular: Positive for leg swelling. Negative for chest pain, orthopnea and PND.  Gastrointestinal: Negative for abdominal pain, diarrhea, melena, nausea and vomiting.  Genitourinary: Negative for dysuria, hematuria and urgency.  Musculoskeletal: Negative for falls and joint pain.  Neurological: Negative for dizziness, tingling, sensory change, focal weakness, seizures, weakness and headaches.  Endo/Heme/Allergies: Negative for polydipsia. Does not bruise/bleed easily.  Psychiatric/Behavioral: Negative for depression and memory loss. The patient is not nervous/anxious.     Nutrition: Heart healthy Tolerating Diet: Yes Tolerating PT: Ambulatory  DRUG ALLERGIES:  No Known Allergies  VITALS:  Blood pressure (!) 151/93, pulse 87, temperature 98.6 F (37 C), temperature source Oral, resp. rate 18, height 5\' 10"  (1.778 m), weight 111.1 kg (244 lb 14.4 oz), SpO2 94 %.  PHYSICAL EXAMINATION:   Physical Exam  GENERAL:  42 y.o.-year-old patient lying in bed in no acute distress.  EYES: Pupils equal, round, reactive to light and accommodation. No scleral icterus. Extraocular muscles intact.  HEENT: Head atraumatic, normocephalic. Oropharynx and nasopharynx clear.  NECK:  Supple, + jugular venous distention. No thyroid enlargement, no tenderness.  LUNGS: Normal breath sounds bilaterally, no  wheezing, bibasilar rales, rhonchi. No use of accessory muscles of respiration.  CARDIOVASCULAR: S1, S2 normal. No murmurs, rubs, or gallops.  ABDOMEN: Soft, nontender, nondistended. Bowel sounds present. No organomegaly or mass.  EXTREMITIES: No cyanosis, clubbing, +2 edema b/l.    NEUROLOGIC: Cranial nerves II through XII are intact. No focal Motor or sensory deficits b/l.   PSYCHIATRIC: The patient is alert and oriented x 3.  SKIN: No obvious rash, lesion, or ulcer.    LABORATORY PANEL:   CBC Recent Labs  Lab 10/20/17 0411  WBC 9.7  HGB 12.1*  HCT 35.5*  PLT 248   ------------------------------------------------------------------------------------------------------------------  Chemistries  Recent Labs  Lab 10/20/17 0411  NA 140  K 3.5  CL 107  CO2 23  GLUCOSE 133*  BUN 19  CREATININE 1.14  CALCIUM 8.9   ------------------------------------------------------------------------------------------------------------------  Cardiac Enzymes Recent Labs  Lab 10/20/17 0411  TROPONINI 0.03*   ------------------------------------------------------------------------------------------------------------------  RADIOLOGY:  Dg Chest Port 1 View  Result Date: 10/20/2017 CLINICAL DATA:  42 y/o M; shortness of breath for 1 week. History of heart failure. EXAM: PORTABLE CHEST 1 VIEW COMPARISON:  01/27/2016 PA and lateral chest radiograph and 01/14/2016 AP chest radiograph. FINDINGS: Stable moderate cardiomegaly given projection and technique. Reticular and peripheral linear opacities, likely interstitial edema. Possible small pleural effusions. Bones are unremarkable. IMPRESSION: Stable cardiomegaly. Interstitial edema and possible small effusions. Electronically Signed   By: Mitzi Hansen M.D.   On: 10/20/2017 04:42     ASSESSMENT AND PLAN:   42 year old male with past medical history of diabetes, ischemic cardiomyopathy ejection fraction of 45%, chronic systolic CHF,  essential hypertension, hyperlipidemia who presents to the hospital due to shortness of breath.  1.  CHF-acute on chronic systolic CHF. This is the cause of patient's worsening shortness of breath and lower extremity edema. Patient apparently is noncompliant and ran out of his medications about 2-3 weeks ago. He is also lost follow-up with cardiology as an outpatient. - cont. Diuresis with IV Lasix, follow I's and O's and daily weights.  -Appreciate cardiology input. Continue carvedilol, losartan.  2. Essential hypertension-continue carvedilol, Norvasc, losartan.  3. Diabetes type 2 without complication-continue sliding scale insulin.  4. Hyperlipidemia-continue Pravachol.    All the records are reviewed and case discussed with Care Management/Social Worker. Management plans discussed with the patient, family and they are in agreement.  CODE STATUS: Full code  DVT Prophylaxis: Lovenox  TOTAL TIME TAKING CARE OF THIS PATIENT: 30 minutes.   POSSIBLE D/C IN 1-2 DAYS, DEPENDING ON CLINICAL CONDITION.   Houston Siren M.D on 10/20/2017 at 2:47 PM  Between 7am to 6pm - Pager - [phone]  After 6pm go to [email or web address] - Social research officer, government  [facility]  Hospitalists  Office  [phone]  CC: Primary care physician; Sherrie Mustache, MD

## 2017-10-20 NOTE — ED Notes (Signed)
Breakfast tray provided to patient at this time.

## 2017-10-20 NOTE — Consult Note (Signed)
Cardiology Consult    Patient ID: Jack Davidson MRN: [id number], DOB/AGE: 43-Feb-1977   Admit date: 10/20/2017 Date of Consult: 10/20/2017  Primary Physician: Sherrie Mustache, MD Primary Cardiologist: Lorine Bears, MD Requesting Provider: Joaquim Lai, MD  Patient Profile    Jack Davidson is a 42 y.o. male with a history of NICM/HFrEF, DMII, HTN, HL, ETOH abuse, tob abuse, OSA, and obesity, who is being seen today for the evaluation of a/c syst CHF at the request of Dr. Cherlynn Kaiser.  Past Medical History   Past Medical History:  Diagnosis Date  . Chronic systolic CHF (congestive heart failure) (HCC)    a. 12/2015 Echo: EF 25-30%, sev dil LV.  . Diabetes mellitus without complication (HCC)   . ETOH abuse    a. Drinks heavily on the weekends.  . Hyperlipidemia   . Hypertensive heart disease   . NICM (nonischemic cardiomyopathy) (HCC)    a. 2015 Neg MV, EF 40%;  b. 12/2015 Echo: EF 25-30% sev dil LV;  c. 12/2015 Cath: nl cors, mild PAH, CO 6.7L/m, EF 25-30%.  . Obesity   . Sleep apnea   . Tobacco use     Past Surgical History:  Procedure Laterality Date  . CARDIAC CATHETERIZATION N/A 03/02/2016   Procedure: Right/Left Heart Cath and Coronary Angiography;  Surgeon: Iran Ouch, MD;  Location: [facility] INVASIVE CV LAB;  Service: Cardiovascular;  Laterality: N/A;     Allergies  No Known Allergies  History of Present Illness    42 year old male with a prior history of hypertension, hyperlipidemia, diabetes, tobacco abuse, alcohol abuse, obesity, sleep apnea, nonischemic cardiomyopathy, and HFrEF.  Cardiomyopathy was initially diagnosed in 2015, at which time EF was 40%.  He had a negative Myoview at that time.  In May 2017, he underwent repeat evaluation in the setting of heart failure.  Echo showed an EF of 25-30%.  Catheterization showed normal coronary arteries.  He has not been seen by cardiology since September 2017.  He lives locally and works as a Estate agent.  He says he  had been doing well and has been compliant with his medications.  Primary care has been refilling his medicines.  He does not weigh himself regularly and is not particularly careful with salt in his diet.  He frequently adds salt to food.  He continues to smoke about 5 cigarettes a day and admits to drinking quite heavily on the weekends, often up to a case of beer and a bottle of tequila over the course of the weekend.  He does not drink at all during the week.  About a week and a half ago, he ran out of Lasix and was told by primary care that he would need to come into the clinic for a visit in order to receive a refill.  A few days ago, he ran out of his antihypertensive medications including carvedilol, amlodipine, and Entresto.  In the setting of being off of multiple medications over the past 2 weeks, he has had progressive dyspnea on exertion, orthopnea, increasing abdominal girth, and lower extremity edema.  He denies chest pain.  Due to worsening of symptoms, he presented to the emergency department on February 27.  EKG was nonacute.  Troponin was mildly elevated at 0.03.  Chest x-ray showed interstitial edema.  Creatinine stable at 1.14.  He was admitted and placed on IV Lasix.  He says he is responded well to Lasix and has had some symptomatic improvement though he remains orthopneic and edematous.  Inpatient Medications    . amLODipine  20 mg Oral Daily  . carvedilol  25 mg Oral BID  . docusate sodium  100 mg Oral BID  . enoxaparin (LOVENOX) injection  40 mg Subcutaneous Q24H  . furosemide  40 mg Intravenous Q6H  . [START ON 10/21/2017] furosemide  40 mg Oral BID  . insulin aspart  0-5 Units Subcutaneous QHS  . insulin aspart  0-9 Units Subcutaneous TID WC  . pravastatin  10 mg Oral q1800    Family History    Family History  Problem Relation Age of Onset  . Diabetes Mother    indicated that his mother is alive.   Social History    Social History   Socioeconomic History  .  Marital status: Single    Spouse name: Not on file  . Number of children: Not on file  . Years of education: Not on file  . Highest education level: Not on file  Social Needs  . Financial resource strain: Not on file  . Food insecurity - worry: Not on file  . Food insecurity - inability: Not on file  . Transportation needs - medical: Not on file  . Transportation needs - non-medical: Not on file  Occupational History  . Occupation: [employer] account executive  Tobacco Use  . Smoking status: Current Every Day Smoker    Packs/day: 0.25    Types: Cigarettes  . Smokeless tobacco: Never Used  Substance and Sexual Activity  . Alcohol use: Yes    Alcohol/week: 0.0 oz    Comment: Drinks heavily on the weekends.  Up to a case of beer and a bottle of tequila over the course of the weekend.  . Drug use: No  . Sexual activity: Not on file  Other Topics Concern  . Not on file  Social History Narrative   Lives locally.  Does not routinely exercise.       Review of Systems    General:  No chills, fever, night sweats or weight changes.  Cardiovascular:  No chest pain, +++ dyspnea on exertion, +++ edema, +++ orthopnea, no palpitations, paroxysmal nocturnal dyspnea. Dermatological: No rash, lesions/masses Respiratory: No cough, +++ dyspnea Urologic: No hematuria, dysuria Abdominal:   +++ increasing abd girth. No nausea, vomiting, diarrhea, bright red blood per rectum, melena, or hematemesis Neurologic:  No visual changes, wkns, changes in mental status. All other systems reviewed and are otherwise negative except as noted above.  Physical Exam    Blood pressure (!) 151/93, pulse 87, temperature 98.6 F (37 C), temperature source Oral, resp. rate 18, height 5\' 10"  (1.778 m), weight 244 lb 14.4 oz (111.1 kg), SpO2 94 %.  General: Pleasant, NAD Psych: Normal affect. Neuro: Alert and oriented X 3. Moves all extremities spontaneously. HEENT: Normal  Neck: Supple without bruits.  JVP to jaw.     Lungs:  Resp regular and unlabored, bibasilar crackles. Heart: RRR no s3, s4, or murmurs. Abdomen: obese, semi-firm and protuberant.  Non-tender, BS + x 4.  Extremities: No clubbing, cyanosis.  Trace bilat ankle edema. DP/PT/Radials 2+ and equal bilaterally.  Labs     Recent Labs    10/20/17 0411  TROPONINI 0.03*   Lab Results  Component Value Date   WBC 9.7 10/20/2017   HGB 12.1 (L) 10/20/2017   HCT 35.5 (L) 10/20/2017   MCV 93.3 10/20/2017   PLT 248 10/20/2017    Recent Labs  Lab 10/20/17 0411  NA 140  K 3.5  CL 107  CO2 23  BUN 19  CREATININE 1.14  CALCIUM 8.9  GLUCOSE 133*     Radiology Studies    Dg Chest Port 1 View  Result Date: 10/20/2017 CLINICAL DATA:  42 y/o M; shortness of breath for 1 week. History of heart failure. EXAM: PORTABLE CHEST 1 VIEW COMPARISON:  01/27/2016 PA and lateral chest radiograph and 01/14/2016 AP chest radiograph. FINDINGS: Stable moderate cardiomegaly given projection and technique. Reticular and peripheral linear opacities, likely interstitial edema. Possible small pleural effusions. Bones are unremarkable. IMPRESSION: Stable cardiomegaly. Interstitial edema and possible small effusions. Electronically Signed   By: Mitzi Hansen M.D.   On: 10/20/2017 04:42    ECG & Cardiac Imaging    Regular sinus rhythm, 97, left atrial enlargement, LVH with repolarization of normalities.  Assessment & Plan    1.  Acute on chronic systolic congestive heart failure/nonischemic cardiomyopathy: Known prior nonischemic cardiomyopathy with an EF of 25-30% in May 2017.  Catheterization at that time showed normal coronary arteries.  Patient has been lost to cardiology follow-up since September 2017.  He says he has been followed by primary care.  Outpatient medications are listed as carvedilol, Entresto, and Lasix, though he has been out of antihypertensives and Lasix over the past 1-2 weeks.  In that setting, he has been having progressive  dyspnea on exertion, increasing abdominal girth, lower extremity edema, and orthopnea.  Due to worsening symptoms, he presented to the emergency department on February 27, where ECG was nonacute however, chest x-ray did show pulmonary edema.  He is -1.6 L since admission though remains markedly volume overloaded with significant JVD. He is currently on IV Lasix every 6 hours and I will titrate and consolidate this to 80 mg twice daily.  Continue beta-blocker.  Is currently ordered amlodipine 20 mg daily.  I will reduce this to 10 mg daily.  Not clear if that he was taking Entresto as he does not have insurance.  Will add losartan and can consider switching to [facility] in the future if that is an option.  Follow-up echo pending.  2.  Hypertensive heart disease: He has been off of his antihypertensives and diuretics for a few days to weeks.  Adjustments as above.  Continue diuresis.  Adding ARB.  3.  Type 2 diabetes mellitus: On metformin at home.  He reports compliance.  Continue sliding scale insulin.  4.  Hyperlipidemia: Continue statin therapy.  He needs follow-up lipids and LFTs.  5.  Tobacco abuse: Smoking about 5 cigarettes a day.  Complete cessation advised.  6.  Alcohol abuse: He drinks quite heavily on the weekends.  We discussed that this may be contributing to his nonischemic cardia myopathy.  Complete cessation advised.  He does not drink during the week and thus I do not think that a CIWA protocol is necessary at this point.  Signed, Nicolasa Ducking, NP 10/20/2017, 2:07 PM  For questions or updates, please contact   Please consult [email or web address] for contact info under Cardiology/STEMI.

## 2017-10-20 NOTE — H&P (Signed)
Jack Davidson is an 42 y.o. male.   Chief Complaint: Shortness of breath HPI: The patient with past medical history of nonischemic cardiomyopathy, alcohol abuse, hypertension, sleep apnea and diabetes presents to the emergency department complaining of shortness of breath.  The patient was not able to refill his Lasix prescription without an appointment which has led him to go without his diuretic for a few days.  He became progressively more dyspneic which prompted him to seek evaluation in the emergency department.  He denies chest pain, nausea or vomiting.  He was found to have oxygen saturation of 85% on room air.  He was placed on supplemental oxygen via nasal cannula and given Lasix prior to the emergency department staff calling the hospitalist service for admission.  Past Medical History:  Diagnosis Date  . Chronic systolic CHF (congestive heart failure) ([place])    a. 12/2015 Echo: EF 25-30%, sev dil LV.  . Diabetes mellitus without complication ([place])   . ETOH abuse   . Hyperlipidemia   . Hypertensive heart disease   . NICM (nonischemic cardiomyopathy) ([place])    a. 2015 Neg MV, EF 40%;  b. 12/2015 Echo: EF 25-30% sev dil LV;  c. 12/2015 Cath: nl cors, mild PAH, CO 6.7L/m, EF 25-30%.  . Obesity   . Sleep apnea   . Tobacco use     Past Surgical History:  Procedure Laterality Date  . CARDIAC CATHETERIZATION N/A 03/02/2016   Procedure: Right/Left Heart Cath and Coronary Angiography;  Surgeon: Wellington Hampshire, MD;  Location: [facility];  Service: Cardiovascular;  Laterality: N/A;    Family History  Problem Relation Age of Onset  . Diabetes Mother    Social History:  reports that he has been smoking cigarettes.  He has been smoking about 0.50 packs per day. he has never used smokeless tobacco. He reports that he drinks alcohol. He reports that he does not use drugs.  Allergies: No Known Allergies  Prior to Admission medications   Medication Sig Start Date End Date Taking?  Authorizing Provider  albuterol (PROVENTIL HFA) 108 (90 BASE) MCG/ACT inhaler Inhale 2 puffs into the lungs every 4 (four) hours as needed for wheezing or shortness of breath. 08/14/15  Yes Carrie Mew, MD  amLODipine (NORVASC) 10 MG tablet Take 20 mg by mouth daily.   Yes [provider]  carvedilol (COREG) 25 MG tablet Take 1 tablet (25 mg total) by mouth 2 (two) times daily. 07/28/16  Yes Wellington Hampshire, MD  furosemide (LASIX) 40 MG tablet Take 40 mg by mouth 2 (two) times daily.   Yes [provider]  lovastatin (MEVACOR) 10 MG tablet Take 10 mg by mouth daily.    Yes [provider]  metFORMIN (GLUCOPHAGE) 500 MG tablet Take 1 tablet (500 mg total) by mouth 2 (two) times daily with a meal. 08/14/15  Yes Carrie Mew, MD  sacubitril-valsartan (ENTRESTO) 49-51 MG Take 1 tablet by mouth 2 (two) times daily. Patient not taking: Reported on 10/20/2017 06/09/16   Wellington Hampshire, MD     Results for orders placed or performed during the hospital encounter of 10/20/17 (from the past 48 hour(s))  Basic metabolic panel     Status: Abnormal   Collection Time: 10/20/17  4:11 AM  Result Value Ref Range   Sodium 140 135 - 145 mmol/L   Potassium 3.5 3.5 - 5.1 mmol/L   Chloride 107 101 - 111 mmol/L   CO2 23 22 - 32 mmol/L  Glucose, Bld 133 (H) 65 - 99 mg/dL   BUN 19 6 - 20 mg/dL   Creatinine, Ser 1.14 0.61 - 1.24 mg/dL   Calcium 8.9 8.9 - 10.3 mg/dL   GFR calc non Af Amer >60 >60 mL/min   GFR calc Af Amer >60 >60 mL/min    Comment: (NOTE) The eGFR has been calculated using the CKD EPI equation. This calculation has not been validated in all clinical situations. eGFR's persistently <60 mL/min signify possible Chronic Kidney Disease.    Anion gap 10 5 - 15    Comment: Performed at [facility], [address] [place], [place] [address]  CBC     Status: Abnormal   Collection Time: 10/20/17  4:11 AM  Result Value Ref Range   WBC 9.7 3.8 -  10.6 K/uL   RBC 3.80 (L) 4.40 - 5.90 MIL/uL   Hemoglobin 12.1 (L) 13.0 - 18.0 g/dL   HCT 35.5 (L) 40.0 - 52.0 %   MCV 93.3 80.0 - 100.0 fL   MCH 31.9 26.0 - 34.0 pg   MCHC 34.1 32.0 - 36.0 g/dL   RDW 14.4 11.5 - 14.5 %   Platelets 248 150 - 440 K/uL    Comment: Performed at [facility], [address] [place], [place] [address]  Troponin I     Status: Abnormal   Collection Time: 10/20/17  4:11 AM  Result Value Ref Range   Troponin I 0.03 (HH) <0.03 ng/mL    Comment: CRITICAL RESULT CALLED TO, READ BACK BY AND VERIFIED WITH VANESSA ASHLEY AT 1916 ON 10/20/17 [place]. Performed at [facility], [address], [place], [place] [address]    Dg Chest Port 1 View  Result Date: 10/20/2017 CLINICAL DATA:  42 y/o M; shortness of breath for 1 week. History of heart failure. EXAM: PORTABLE CHEST 1 VIEW COMPARISON:  01/27/2016 PA and lateral chest radiograph and 01/14/2016 AP chest radiograph. FINDINGS: Stable moderate cardiomegaly given projection and technique. Reticular and peripheral linear opacities, likely interstitial edema. Possible small pleural effusions. Bones are unremarkable. IMPRESSION: Stable cardiomegaly. Interstitial edema and possible small effusions. Electronically Signed   By: Kristine Garbe M.D.   On: 10/20/2017 04:42    Review of Systems  Constitutional: Negative for chills and fever.  HENT: Negative for sore throat and tinnitus.   Eyes: Negative for blurred vision and redness.  Respiratory: Positive for shortness of breath. Negative for cough.   Cardiovascular: Negative for chest pain, palpitations, orthopnea and PND.  Gastrointestinal: Negative for abdominal pain, diarrhea, nausea and vomiting.  Genitourinary: Negative for dysuria, frequency and urgency.  Musculoskeletal: Negative for joint pain and myalgias.  Skin: Negative for rash.       No lesions  Neurological: Negative for speech change, focal weakness and weakness.   Endo/Heme/Allergies: Does not bruise/bleed easily.       No temperature intolerance  Psychiatric/Behavioral: Negative for depression and suicidal ideas.    Blood pressure (!) 147/94, pulse 81, temperature 98.7 F (37.1 C), temperature source Oral, resp. rate (!) 21, height 5' 10"  (1.778 m), weight 108.9 kg (240 lb), SpO2 95 %. Physical Exam  Vitals reviewed. Constitutional: He is oriented to person, place, and time. He appears well-developed and well-nourished. No distress. Nasal cannula in place.  HENT:  Head: Normocephalic and atraumatic.  Mouth/Throat: Oropharynx is clear and moist.  Eyes: Conjunctivae and EOM are normal. Pupils are equal, round, and reactive to light. No scleral icterus.  Neck: Normal range of motion.  Neck supple. No JVD present. No tracheal deviation present. No thyromegaly present.  Cardiovascular: Normal rate, regular rhythm and normal heart sounds. Exam reveals no gallop and no friction rub.  No murmur heard. Respiratory: Effort normal and breath sounds normal. No respiratory distress.  GI: Soft. Bowel sounds are normal. He exhibits no distension. There is no tenderness.  Genitourinary:  Genitourinary Comments: Deferred  Musculoskeletal: Normal range of motion. He exhibits no edema.  Lymphadenopathy:    He has no cervical adenopathy.  Neurological: He is alert and oriented to person, place, and time. No cranial nerve deficit.  Skin: Skin is warm and dry. No rash noted. No erythema.  Psychiatric: He has a normal mood and affect. His behavior is normal. Judgment and thought content normal.     Assessment/Plan This is a 42 year old male admitted for CHF exacerbation. 1.  CHF: Acute on chronic; systolic.  The patient has received Lasix which we will continue every 6 hours until patient is euvolemic.  Optimize blood pressure control.  Manage sleep apnea as well.  Consult cardiology regarding restart of Entresto 2.  Hypertension: Uncontrolled; continue amlodipine  and carvedilol.  Hydralazine as needed 3.  Diabetes mellitus type 2: Hold oral hypoglycemic agents.  Sliding-scale insulin while hospitalized. 4.  Hyperlipidemia: Continue statin therapy 5.  DVT prophylaxis: Lovenox 6.  GI prophylaxis: None The patient is a full code.  Time spent on initial exam patient care approximately 45 minutes  Harrie Foreman, MD 10/20/2017, 5:56 AM

## 2017-10-21 ENCOUNTER — Encounter: Payer: Self-pay | Admitting: Nurse Practitioner

## 2017-10-21 ENCOUNTER — Observation Stay (HOSPITAL_BASED_OUTPATIENT_CLINIC_OR_DEPARTMENT_OTHER)
Admit: 2017-10-21 | Discharge: 2017-10-21 | Disposition: A | Discharge status: Home / Self Care | Payer: Self-pay | Attending: Nurse Practitioner | Admitting: Nurse Practitioner

## 2017-10-21 DIAGNOSIS — I340 Nonrheumatic mitral (valve) insufficiency: Secondary | ICD-10-CM

## 2017-10-21 LAB — BASIC METABOLIC PANEL
ANION GAP: 10 (ref 5–15)
BUN: 18 mg/dL (ref 6–20)
CO2: 27 mmol/L (ref 22–32)
Calcium: 8.8 mg/dL — ABNORMAL LOW (ref 8.9–10.3)
Chloride: 103 mmol/L (ref 101–111)
Creatinine, Ser: 1.19 mg/dL (ref 0.61–1.24)
GFR calc non Af Amer: >60 mL/min (ref >60)
Glucose, Bld: 174 mg/dL — ABNORMAL HIGH (ref 65–99)
POTASSIUM: 3.1 mmol/L — AB (ref 3.5–5.1)
SODIUM: 140 mmol/L (ref 135–145)

## 2017-10-21 LAB — GLUCOSE, CAPILLARY
Glucose-Capillary: 121 mg/dL — ABNORMAL HIGH (ref 65–99)
Glucose-Capillary: 144 mg/dL — ABNORMAL HIGH (ref 65–99)
Glucose-Capillary: 154 mg/dL — ABNORMAL HIGH (ref 65–99)
Glucose-Capillary: 140 mg/dL — ABNORMAL HIGH (ref 65–99)

## 2017-10-21 LAB — ECHOCARDIOGRAM COMPLETE
HEIGHTINCHES: 70 in
WEIGHTICAEL: 3825.6 [oz_av]

## 2017-10-21 MED ORDER — AMLODIPINE BESYLATE 5 MG PO TABS
5.0000 mg | ORAL_TABLET | Freq: Every day | ORAL | Status: DC
  Administered 2017-10-21 – 2017-10-22 (×2): 5 mg via ORAL
  Filled 2017-10-21 (×2): qty 1

## 2017-10-21 MED ORDER — LOSARTAN POTASSIUM 50 MG PO TABS: 50.0000 mg | ORAL_TABLET | Freq: Every day | ORAL | Status: DC

## 2017-10-21 MED ORDER — POTASSIUM CHLORIDE CRYS ER 20 MEQ PO TBCR
40.0000 meq | EXTENDED_RELEASE_TABLET | Freq: Two times a day (BID) | ORAL | Status: DC
  Administered 2017-10-21 – 2017-10-22 (×3): 40 meq via ORAL
  Filled 2017-10-21 (×3): qty 2

## 2017-10-21 MED ORDER — SACUBITRIL-VALSARTAN 49-51 MG PO TABS
1.0000 | ORAL_TABLET | Freq: Two times a day (BID) | ORAL | Status: DC
  Administered 2017-10-21 – 2017-10-22 (×3): 1 via ORAL
  Filled 2017-10-21 (×4): qty 1

## 2017-10-21 NOTE — Care Management (Signed)
Patient without payor.  There is confusion as to whether patient has medicaid.  He does not have a medicaid card and says he had it because he had daughters that "aged out" and the medicaid stopped.  Provided patient with application for [facility] and discussed in detail the application completion process.  Discussed decisions cardiology is trying to make regarding discharge on Entresto.  Provided patient with scales. CM noticed that patient off the unit and outside the hospital.  Notified nursing and patient instructed not to leave the unit

## 2017-10-21 NOTE — Progress Notes (Signed)
*  PRELIMINARY RESULTS* Echocardiogram 2D Echocardiogram has been performed.  Cristela Blue 10/21/2017, 3:13 PM

## 2017-10-21 NOTE — Progress Notes (Signed)
Nutrition Education Note  RD consulted for nutrition education regarding new onset CHF.  42 year old male with past medical history of diabetes, ischemic cardiomyopathy ejection fraction of 45%, chronic systolic CHF, essential hypertension, hyperlipidemia who presents to the hospital due to shortness of breath.  RD provided "Low Sodium Nutrition Therapy" handout from the Academy of [employer] and Dietetics. Reviewed patient's dietary recall. Provided examples on ways to decrease sodium intake in diet. Discouraged intake of processed foods and use of salt shaker. Encouraged fresh fruits and vegetables as well as whole grain sources of carbohydrates to maximize fiber intake.   RD discussed why it is important for patient to adhere to diet recommendations, and emphasized the role of fluids, foods to avoid, and importance of weighing self daily. Teach back method used.  Expect poor compliance.  Body mass index is 34.31 kg/m. Pt meets criteria for obesity based on current BMI.  Current diet order is HH/CHO, patient is consuming approximately 100% of meals at this time. Pt reports that he is still hungry after meals; RD will add double protein with meals and liberalize diet as a heart healthy diet restricts protein also.   Labs and medications reviewed. No further nutrition interventions warranted at this time. RD contact information provided. If additional nutrition issues arise, please re-consult RD.   Betsey Holiday MS, RD, LDN Pager #- [phone] After Hours Pager: [phone]

## 2017-10-21 NOTE — Progress Notes (Addendum)
Progress Note  Patient Name: Jack Davidson Date of Encounter: 10/21/2017  Primary Cardiologist: Lorine Bears, MD  Subjective   Breathing improving.  Not yet @ baseline.  Still orthopneic.  Abd less firm.  Inpatient Medications    Scheduled Meds: . amLODipine  10 mg Oral Daily  . carvedilol  25 mg Oral BID  . docusate sodium  100 mg Oral BID  . enoxaparin (LOVENOX) injection  40 mg Subcutaneous Q24H  . furosemide  80 mg Intravenous Q8H  . insulin aspart  0-5 Units Subcutaneous QHS  . insulin aspart  0-9 Units Subcutaneous TID WC  . losartan  25 mg Oral Daily  . pravastatin  10 mg Oral q1800   Continuous Infusions:  PRN Meds: acetaminophen **OR** acetaminophen, albuterol, ondansetron **OR** ondansetron (ZOFRAN) IV   Vital Signs    Vitals:   10/20/17 1946 10/20/17 2300 10/21/17 0336 10/21/17 0804  BP: (!) 156/87 (!) 146/94 (!) 136/91 (!) 133/91  Pulse: 81  72 76  Resp: 17  18 16   Temp: 98.7 F (37.1 C)  97.8 F (36.6 C) (!) 97.5 F (36.4 C)  TempSrc: Oral  Oral Oral  SpO2: 96% 93% 98% 94%  Weight:   239 lb 1.6 oz (108.5 kg)   Height:        Intake/Output Summary (Last 24 hours) at 10/21/2017 0805 Last data filed at 10/21/2017 0527 Gross per 24 hour  Intake 836 ml  Output 3830 ml  Net -2994 ml   Filed Weights   10/20/17 0346 10/20/17 1207 10/21/17 0336  Weight: 240 lb (108.9 kg) 244 lb 14.4 oz (111.1 kg) 239 lb 1.6 oz (108.5 kg)    Physical Exam   GEN: Well nourished, well developed, in no acute distress.  HEENT: Grossly normal.  Neck: Supple, JVP ~ 14 cm, no carotid bruits, or masses. Cardiac: RRR, no murmurs, rubs, or gallops. No clubbing, cyanosis, edema.  Radials/DP/PT 2+ and equal bilaterally.  Respiratory:  Respirations regular and unlabored, clear to auscultation bilaterally. GI: Soft, nontender, nondistended, BS + x 4. MS: no deformity or atrophy. Skin: warm and dry, no rash. Neuro:  Strength and sensation are intact. Psych: AAOx3.  Normal  affect.  Labs    Chemistry Recent Labs  Lab 10/20/17 0411 10/21/17 0432  NA 140 140  K 3.5 3.1*  CL 107 103  CO2 23 27  GLUCOSE 133* 174*  BUN 19 18  CREATININE 1.14 1.19  CALCIUM 8.9 8.8*  GFRNONAA >60 >60  GFRAA >60 >60  ANIONGAP 10 10     Hematology Recent Labs  Lab 10/20/17 0411  WBC 9.7  RBC 3.80*  HGB 12.1*  HCT 35.5*  MCV 93.3  MCH 31.9  MCHC 34.1  RDW 14.4  PLT 248    Cardiac Enzymes Recent Labs  Lab 10/20/17 0411  TROPONINI 0.03*   BNP Recent Labs  Lab 10/20/17 0411  BNP 590.0*      Radiology    Dg Chest Port 1 View  Result Date: 10/20/2017 CLINICAL DATA:  42 y/o M; shortness of breath for 1 week. History of heart failure. EXAM: PORTABLE CHEST 1 VIEW COMPARISON:  01/27/2016 PA and lateral chest radiograph and 01/14/2016 AP chest radiograph. FINDINGS: Stable moderate cardiomegaly given projection and technique. Reticular and peripheral linear opacities, likely interstitial edema. Possible small pleural effusions. Bones are unremarkable. IMPRESSION: Stable cardiomegaly. Interstitial edema and possible small effusions. Electronically Signed   By: Mitzi Hansen M.D.   On: 10/20/2017 04:42  Telemetry    rsr - Personally Reviewed  Cardiac Studies   2D Echocardiogram 5.2017  Study Conclusions  - Left ventricle: The cavity size was mildly dilated. Systolic   function was mildly reduced. The estimated ejection fraction was   in the range of 45% to 50%. - Left atrium: The atrium was mildly dilated.  Patient Profile     42 y.o. male with a history of NICM/HFrEF, DMII, HTN, HL, ETOH abuse, tob abuse, OSA, and obesity, who was admitted 2/26 with progressive dyspnea, orthopnea, edema, and abd girth in the setting of running out of bp meds and lasix.  Assessment & Plan    1.  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  45-50% by echo in 12/2015.  Nl cors on cath 02/2016.  Ran out of meds  within the past 2 wks.  He's not really sure what his CHF meds were.  Responding well to IV lasix.  Minus 3.2L overnight, 5.1L since admission.  Wt down 5 lbs since admission.  He thinks dry weight is closer to 220.  Still moderately volume overloaded on exam.  Cont IV lasix @ current dose.  Renal fxn stable.  BP improved.  He says that he has medicaid and can afford meds.  Will switch losartan to previous listed dose of entresto and reduce amlodipine further.  Supp K.  If stable tomorrow, will add spironolactone.  F/u echo.   2.  HTN Heart Dzs:  Improved with med resumption and adjustments.  Will switch losartan to entresto today.  Will plan to add spiro tomorrow if renal fxn stable.  Cont to wean down amlodipine and titrate CHF meds.  Hydral/nitrates also an option, though noncompliance may result in rebound HTN.  3.  DM II:  Cont ssi.  On metformin @ home.  A1c 6.1.  4.  HL:  Cont statin. F/u lipids in am.  5.  Tob Abuse:  Cessation advised.  6. ETOH Abuse:  Drinks heavily on the weekends.  Likely contributing to NICM.  Cessation advised.  7.  Hypokalemia:  I've ordered additional supplementation.  Signed, Nicolasa Ducking, NP  10/21/2017, 8:05 AM    For questions or updates, please contact   Please consult [email or web address] for contact info under Cardiology/STEMI.   Attending Note Patient seen and examined, agree with detailed note above,  Patient presentation and plan discussed on rounds.   Legs are less swollen, Abdomen still distended but improved Less coughing, less shortness of breath on exertion  3.2 L negative total, 6 L out this hospital admission Additional 1.2 out today  JVP 12+, lungs clear to auscultation bilaterally heart sounds regular normal S1-S2 no murmurs appreciated abdomen mildly distended, soft nontender, no significant lower extremity edema  Lab work reviewed potassium 3.1 creatinine 1.19 glucose 174  A/P: --Acute on chronic diastolic and systolic  CHF obesity, sleep apnea, alcohol abuse, medication noncompliance , poor diet/high salt and fluid Continue Lasix 80 every 8, replete potassium aggressively  Entresto Carvedilol, Aldactone  wean off amlodipine given his systolic dysfunction  --- Hypertensive heart disease Coreg, entresto, diuresis If blood pressure continues to run high would increase dose of Entresto Wean off amlodipine Need to check how he is getting his Entresto and if able to afford it  ----Alcohol abuse Case of beer and tequila on the weekends Discussed the ramifications, cardiomyopathy  --- Smoker Smoking cessation techniques again discussed with him  Greater than 50% was spent in  counseling and coordination of care with patient Total encounter time 25 minutes or more   Signed: Dossie Arbour  M.D., Ph.D. [facility]

## 2017-10-21 NOTE — Progress Notes (Signed)
[facility] [place] [facility]   PATIENT NAME: Jack Davidson    MR#:  [id number]  DATE OF BIRTH:  August 25, 1975  SUBJECTIVE:   Patient here due to shortness of breath and noted to be in CHF.  Likely due to medical non-compliance as he has not taken his meds in 2-3 weeks.  Responding well to IV diuresis and shortness of breath has improved since yesterday.  REVIEW OF SYSTEMS:    Review of Systems  Constitutional: Negative for fever and weight loss.  HENT: Negative for congestion, nosebleeds and tinnitus.   Eyes: Negative for blurred vision, double vision and redness.  Respiratory: Positive for shortness of breath. Negative for cough and hemoptysis.   Cardiovascular: Positive for leg swelling. Negative for chest pain, orthopnea and PND.  Gastrointestinal: Negative for abdominal pain, diarrhea, melena, nausea and vomiting.  Genitourinary: Negative for dysuria, hematuria and urgency.  Musculoskeletal: Negative for falls and joint pain.  Neurological: Negative for dizziness, tingling, sensory change, focal weakness, seizures, weakness and headaches.  Endo/Heme/Allergies: Negative for polydipsia. Does not bruise/bleed easily.  Psychiatric/Behavioral: Negative for depression and memory loss. The patient is not nervous/anxious.     Nutrition: Heart healthy Tolerating Diet: Yes Tolerating PT: Ambulatory  DRUG ALLERGIES:  No Known Allergies  VITALS:  Blood pressure (!) 133/91, pulse 76, temperature (!) 97.5 F (36.4 C), temperature source Oral, resp. rate 16, height 5\' 10"  (1.778 m), weight 108.5 kg (239 lb 1.6 oz), SpO2 94 %.  PHYSICAL EXAMINATION:   Physical Exam  GENERAL:  42 y.o.-year-old patient lying in bed in no acute distress.  EYES: Pupils equal, round, reactive to light and accommodation. No scleral icterus. Extraocular muscles intact.  HEENT: Head atraumatic, normocephalic. Oropharynx and nasopharynx clear.  NECK:  Supple, + jugular venous distention.  No thyroid enlargement, no tenderness.  LUNGS: Normal breath sounds bilaterally, no wheezing, bibasilar rales, rhonchi. No use of accessory muscles of respiration.  CARDIOVASCULAR: S1, S2 normal. No murmurs, rubs, or gallops.  ABDOMEN: Soft, nontender, nondistended. Bowel sounds present. No organomegaly or mass.  EXTREMITIES: No cyanosis, clubbing, +2 edema b/l.    NEUROLOGIC: Cranial nerves II through XII are intact. No focal Motor or sensory deficits b/l.   PSYCHIATRIC: The patient is alert and oriented x 3.  SKIN: No obvious rash, lesion, or ulcer.    LABORATORY PANEL:   CBC Recent Labs  Lab 10/20/17 0411  WBC 9.7  HGB 12.1*  HCT 35.5*  PLT 248   ------------------------------------------------------------------------------------------------------------------  Chemistries  Recent Labs  Lab 10/21/17 0432  NA 140  K 3.1*  CL 103  CO2 27  GLUCOSE 174*  BUN 18  CREATININE 1.19  CALCIUM 8.8*   ------------------------------------------------------------------------------------------------------------------  Cardiac Enzymes Recent Labs  Lab 10/20/17 0411  TROPONINI 0.03*   ------------------------------------------------------------------------------------------------------------------  RADIOLOGY:  Dg Chest Port 1 View  Result Date: 10/20/2017 CLINICAL DATA:  42 y/o M; shortness of breath for 1 week. History of heart failure. EXAM: PORTABLE CHEST 1 VIEW COMPARISON:  01/27/2016 PA and lateral chest radiograph and 01/14/2016 AP chest radiograph. FINDINGS: Stable moderate cardiomegaly given projection and technique. Reticular and peripheral linear opacities, likely interstitial edema. Possible small pleural effusions. Bones are unremarkable. IMPRESSION: Stable cardiomegaly. Interstitial edema and possible small effusions. Electronically Signed   By: Mitzi Hansen M.D.   On: 10/20/2017 04:42     ASSESSMENT AND PLAN:   42 year old male with past medical  history of diabetes, ischemic cardiomyopathy ejection fraction of 45%, chronic systolic CHF, essential hypertension,  hyperlipidemia who presents to the hospital due to shortness of breath.  1. CHF-acute on chronic combined diastolic/systolic CHF. This is the cause of patient's worsening shortness of breath and lower extremity edema. Patient apparently is noncompliant and ran out of his medications about 2-3 weeks ago. He is also lost follow-up with cardiology as an outpatient. - cont. Diuresis with IV Lasix and about 5 L(-) since admission.  -Appreciate cardiology input. Continue carvedilol and taken off Losartan and started on Entresto as per Cards. Norvasc dose lowered.   2. Essential hypertension-continue carvedilol, Norvasc - BP stable.   3. Diabetes type 2 without complication-continue sliding scale insulin. - BS STable.   4. Hyperlipidemia-continue Pravachol.  5. Hypokalemia-secondary to aggressive IV diuresis. Continue potassium supplementation.  All the records are reviewed and case discussed with Care Management/Social Worker. Management plans discussed with the patient, family and they are in agreement.  CODE STATUS: Full code  DVT Prophylaxis: Lovenox  TOTAL TIME TAKING CARE OF THIS PATIENT: 30 minutes.   POSSIBLE D/C IN 1-2 DAYS, DEPENDING ON CLINICAL CONDITION.   Houston Siren M.D on 10/21/2017 at 1:47 PM  Between 7am to 6pm - Pager - [phone]  After 6pm go to [email or web address] - Social research officer, government  [facility]  Office  [phone]  CC: Primary care physician; Sherrie Mustache, MD

## 2017-10-21 NOTE — Progress Notes (Signed)
Pt potassium is at 3.1. Notify prime. Awaiting callback. Will continue to monitor.

## 2017-10-21 NOTE — Plan of Care (Signed)
  Progressing Clinical Measurements: Respiratory complications will improve 10/21/2017 0044 - Progressing by Myles Gip, RN Pain Managment: General experience of comfort will improve 10/21/2017 0044 - Progressing by Myles Gip, RN Safety: Ability to remain free from injury will improve 10/21/2017 0044 - Progressing by Myles Gip, RN

## 2017-10-21 NOTE — Progress Notes (Signed)
42 year old male admitted with acute on chronic systolic heart failure.  Patient has hx of NCIM - EF 25-30%, alcohol abuse, HTN, sleep apnea, DM2, OSA, current every day smoker.  Patient ran out of his medications a couple of weeks ago and has not seen cardiology for approximately 2 years.  Patient was previously seeen by Dr. Kirke Corin.  Patient reports having poor diet, high fluid intake, and fast food.  Patient apparently ran out of Dalton, Coreg, and Amlodipine.  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  Uncertain how long patient has been laid off, but he is not receiving unemployment benefits.    Patient does odd jobs.    CHF Education:?? Educational session with patient completed.  ? Provided patient with "Living Better with Heart Failure" packet. Briefly reviewed definition of heart failure and signs and symptoms of an exacerbation.  Explained to patient that HF is a chronic illness which requires self-assessment / self-management along with help from the cardiologist/PCP/HF Clinic.? ?? *Reviewed importance of and reason behind checking weight daily in the AM, after using the bathroom, but before getting dressed.?Patient has access to scales.  While HF is not a new diagnosis for patient, patient admits that he has not been weighing himself.  ? ? Reviewed the following information with patient:  *Discussed when to call the Dr= weight gain of >2-3lb overnight of 5lb in a week,  *Discussed yellow zone= call MD: weight gain of >2-3lb overnight of 5lb in a week, increased swelling, increased SOB when lying down, chest discomfort, dizziness, increased fatigue *Red Zone= call 911: struggle to breath, fainting or near fainting, significant chest pain  *Reviewed low sodium diet-provided handout of recommended and not recommended foods. ?Reviewed reading labels with patient. Discussed fluid intake with patient as well. Patient not currently on a fluid  restriction, but advised no more than 8-8 ounces glass of fluids per day.?Note: ?Dietitian Consultation entered for diet education on low sodium heart healthy/carb modified diet.    *Instructed patient to take medications as prescribed for heart failure. Explained briefly why pt is on the medications (either make you feel better, live longer or keep you out of the hospital) and discussed monitoring and side effects.   *Smoking Cessation?- Patient is a current every day smoker.  Patient informed this RN that he smokes maybe 3 cigarettes per day.  Patient has quit in the past, but reports it has been a long, long time ago.  "Thinking about Quitting, Yes You Can!" informational sheet reviewed with patient.  Patient reported using Chantix and Nicotine Patches in the past, but patient reported he was unsuccessful at quitting.    *Discussed the benefits of exercise.?Patient reports his is not currently exercising.  He also stated that he used to walk a lot for exercise.  Walking encouraged.  Patient is currently unemployed and has no payor source to participate in Pulmonary Rehab. Encouraged walking and gradually increasing to 30 minutes per day up to 150  Minutes per week per AHA guidelines. Encouraged patient to remain as active as possible.    *[facility]- Explained the role of the [facility]. ?Explained to patient the HF Clinic does not replace his PCP/cardiologist, but is an additional resource to help him manage his HF and to keep him out of the hospital.  Appointment in the HF Clinic is scheduled for 10/28/2017 at 9:20 a.m. ?Patient encouraged to keep this appointment.  Again,  the 5 Steps to Living Better with Heart Failure were reviewed with patient.   ?  Patient attentive and engaged in education.  Patient stated, "I need to do better!"  Motivation for taking better care of himself:  "I have four daughters - the youngest are twins age 69. I definitely need to do better."     Patient thanked this RN for reviewing the above information.    ? Army Melia, RN, BSN, [facility] Cardiovascular and Pulmonary Nurse Navigator

## 2017-10-22 ENCOUNTER — Telehealth: Payer: Self-pay | Admitting: Cardiovascular Disease

## 2017-10-22 ENCOUNTER — Telehealth: Payer: Self-pay

## 2017-10-22 DIAGNOSIS — I509 Heart failure, unspecified: Secondary | ICD-10-CM

## 2017-10-22 LAB — BASIC METABOLIC PANEL
ANION GAP: 10 (ref 5–15)
BUN: 26 mg/dL — AB (ref 6–20)
CHLORIDE: 102 mmol/L (ref 101–111)
CO2: 27 mmol/L (ref 22–32)
Calcium: 9.3 mg/dL (ref 8.9–10.3)
Creatinine, Ser: 1.18 mg/dL (ref 0.61–1.24)
GFR calc Af Amer: >60 mL/min (ref >60)
GLUCOSE: 127 mg/dL — AB (ref 65–99)
POTASSIUM: 3.6 mmol/L (ref 3.5–5.1)
Sodium: 139 mmol/L (ref 135–145)

## 2017-10-22 LAB — LIPID PANEL
CHOL/HDL RATIO: 4.1 ratio
Cholesterol: 176 mg/dL (ref 0–200)
HDL: 43 mg/dL (ref >40)
LDL Cholesterol: 89 mg/dL (ref 0–99)
Triglycerides: 221 mg/dL — ABNORMAL HIGH (ref <150)
VLDL: 44 mg/dL — AB (ref 0–40)

## 2017-10-22 LAB — GLUCOSE, CAPILLARY: Glucose-Capillary: 165 mg/dL — ABNORMAL HIGH (ref 65–99)

## 2017-10-22 MED ORDER — SACUBITRIL-VALSARTAN 49-51 MG PO TABS: 1.0000 | ORAL_TABLET | Freq: Two times a day (BID) | ORAL | 1 refills | Status: AC

## 2017-10-22 MED ORDER — POTASSIUM CHLORIDE CRYS ER 20 MEQ PO TBCR: 40.0000 meq | EXTENDED_RELEASE_TABLET | Freq: Every day | ORAL | Status: DC

## 2017-10-22 MED ORDER — SPIRONOLACTONE 25 MG PO TABS
25.0000 mg | ORAL_TABLET | Freq: Every day | ORAL | Status: DC
  Administered 2017-10-22: 25 mg via ORAL
  Filled 2017-10-22: qty 1

## 2017-10-22 MED ORDER — FUROSEMIDE 40 MG PO TABS: 40.0000 mg | ORAL_TABLET | Freq: Two times a day (BID) | ORAL | 1 refills | Status: AC

## 2017-10-22 MED ORDER — FUROSEMIDE 40 MG PO TABS
40.0000 mg | ORAL_TABLET | Freq: Two times a day (BID) | ORAL | Status: DC
  Administered 2017-10-22: 40 mg via ORAL
  Filled 2017-10-22: qty 1

## 2017-10-22 MED ORDER — POTASSIUM CHLORIDE CRYS ER 20 MEQ PO TBCR: 40.0000 meq | EXTENDED_RELEASE_TABLET | Freq: Every day | ORAL | 1 refills | Status: AC

## 2017-10-22 MED ORDER — AMLODIPINE BESYLATE 5 MG PO TABS: 5.0000 mg | ORAL_TABLET | Freq: Every day | ORAL | 1 refills | Status: AC

## 2017-10-22 MED ORDER — SPIRONOLACTONE 25 MG PO TABS: 25.0000 mg | ORAL_TABLET | Freq: Every day | ORAL | 1 refills | Status: AC

## 2017-10-22 NOTE — Telephone Encounter (Signed)
[facility] for acute chf  3/7 with tina  4/1 with Alycia Rossetti

## 2017-10-22 NOTE — Progress Notes (Signed)
This RN told pt after reviewing AVS and medication that he would need to wait for Naan, CM to speak with him re medications.  This RN found pt's room empty.  This RN checked with CM who stated she did talk with pt, but then returned his scripts to his chart.  This RN then called the pt to let him know his scripts were still here.  Pt stated that he would come pick them up later today.

## 2017-10-22 NOTE — Care Management (Signed)
Patient left the unit without any of his discharge paper work or scripts.  His [facility]s had been [facility] but patient needed to take his Entresto script to local pharmacy so could use the 30 day coupon. Patient was informed of this by CM

## 2017-10-22 NOTE — Telephone Encounter (Signed)
l mom to schedule 1 month f/u   

## 2017-10-22 NOTE — Care Management (Signed)
CM just saw patient come back onto the unit to collect his discharge paperwork.  When asked if he obtained his meds he stated "no." CM asked why and patient responded that he dropped off the paper work for the application and did not go back to get them  Stated "Oh I can just get them Monday."

## 2017-10-22 NOTE — Plan of Care (Signed)
  Progressing Clinical Measurements: Respiratory complications will improve 10/22/2017 0150 - Progressing by Myles Gip, RN Pain Managment: General experience of comfort will improve 10/22/2017 0150 - Progressing by Myles Gip, RN Safety: Ability to remain free from injury will improve 10/22/2017 0150 - Progressing by Myles Gip, RN

## 2017-10-22 NOTE — Discharge Planning (Signed)
Discharge instructions reviewed with pt. Pt verbalizes understanding.

## 2017-10-22 NOTE — Progress Notes (Signed)
Progress Note  Patient Name: Jack Davidson Date of Encounter: 10/22/2017  Primary Cardiologist: Lorine Bears, MD  Subjective   Breathing much improved.  Belly much softer and he feels that it is at baseline.  He ambulated in the hall yesterday and felt good.  Eager to go home.  Inpatient Medications    Scheduled Meds: . amLODipine  5 mg Oral Daily  . carvedilol  25 mg Oral BID  . docusate sodium  100 mg Oral BID  . enoxaparin (LOVENOX) injection  40 mg Subcutaneous Q24H  . furosemide  80 mg Intravenous Q8H  . insulin aspart  0-5 Units Subcutaneous QHS  . insulin aspart  0-9 Units Subcutaneous TID WC  . potassium chloride  40 mEq Oral BID  . pravastatin  10 mg Oral q1800  . sacubitril-valsartan  1 tablet Oral BID   Continuous Infusions:  PRN Meds: acetaminophen **OR** acetaminophen, albuterol, ondansetron **OR** ondansetron (ZOFRAN) IV   Vital Signs    Vitals:   10/21/17 1931 10/22/17 0508 10/22/17 0522 10/22/17 0743  BP: 138/78 (!) 109/94 133/69 (!) 142/86  Pulse: 80 79 72 75  Resp:      Temp: 97.8 F (36.6 C) (!) 97.5 F (36.4 C) 98.2 F (36.8 C) 97.7 F (36.5 C)  TempSrc: Oral Oral Axillary Oral  SpO2: 96% 96% 95% 97%  Weight:  239 lb 6.4 oz (108.6 kg)    Height:        Intake/Output Summary (Last 24 hours) at 10/22/2017 0908 Last data filed at 10/22/2017 0800 Gross per 24 hour  Intake 360 ml  Output 4450 ml  Net -4090 ml   Filed Weights   10/20/17 1207 10/21/17 0336 10/22/17 0508  Weight: 244 lb 14.4 oz (111.1 kg) 239 lb 1.6 oz (108.5 kg) 239 lb 6.4 oz (108.6 kg)    Physical Exam   GEN: Well nourished, well developed, in no acute distress.  HEENT: Grossly normal.  Neck: Supple, no JVD, carotid bruits, or masses. Cardiac: RRR, no murmurs, rubs, or gallops. No clubbing, cyanosis, edema.  Radials/DP/PT 2+ and equal bilaterally.  Respiratory:  Respirations regular and unlabored, clear to auscultation bilaterally. GI: Soft, nontender, nondistended, BS  + x 4. MS: no deformity or atrophy. Skin: warm and dry, no rash. Neuro:  Strength and sensation are intact. Psych: AAOx3.  Normal affect.  Labs    Chemistry Recent Labs  Lab 10/20/17 0411 10/21/17 0432 10/22/17 0404  NA 140 140 139  K 3.5 3.1* 3.6  CL 107 103 102  CO2 23 27 27   GLUCOSE 133* 174* 127*  BUN 19 18 26*  CREATININE 1.14 1.19 1.18  CALCIUM 8.9 8.8* 9.3  GFRNONAA >60 >60 >60  GFRAA >60 >60 >60  ANIONGAP 10 10 10      Hematology Recent Labs  Lab 10/20/17 0411  WBC 9.7  RBC 3.80*  HGB 12.1*  HCT 35.5*  MCV 93.3  MCH 31.9  MCHC 34.1  RDW 14.4  PLT 248    Cardiac Enzymes Recent Labs  Lab 10/20/17 0411  TROPONINI 0.03*    BNP Recent Labs  Lab 10/20/17 0411  BNP 590.0*      Radiology    No results found.  Telemetry    RSR - Personally Reviewed  Cardiac Studies   2D Echocardiogram 2.28.2019  Study Conclusions  - Left ventricle: The cavity size was moderately dilated. There was   moderate concentric hypertrophy. Systolic function was severely   reduced. The estimated ejection fraction was in  the range of 20%   to 25%. Diffuse hypokinesis. Regional wall motion abnormalities   cannot be excluded. Features are consistent with a pseudonormal   left ventricular filling pattern, with concomitant abnormal   relaxation and increased filling pressure (grade 2 diastolic   dysfunction). - Mitral valve: There was mild regurgitation. - Left atrium: The atrium was moderately dilated. - Right ventricle: Systolic function was normal. - Pulmonary arteries: Systolic pressure could not be accurately   estimated.  Patient Profile     43 y.o. male with a history of NICM/HFrEF, DMII, HTN, HL, ETOH abuse, tob abuse, OSA, and obesity, who was admitted 2/26 with progressive dyspnea, orthopnea, edema, and abd girth in the setting of running out of bp meds and lasix.    Assessment & Plan    1.  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  Echo this admission shows EF of 20-25% with diffuse hypokinesis and grade 2 diastolic dysfunction.  EF had previously improved in 2017.  He has known normal coronary arteries by catheterization 2017.  I suspect noncompliance of medications playing a large role in his recurrent cardiomyopathy.  He has responded well to IV diuretics and was -2.4 L overnight, an BUN bumping slightly.  He says previous home dose of Lasix was 40 mg daily but he sometimes took twice daily.  I will switch him to Lasix 40 mg twice daily.  As previously planned, we will also add Spironolactone 25 mg daily beginning today.  In that setting, I will back down on his potassium supplementation.  Continue beta-blocker and Entresto.  Care management note reviewed.  Patient previously had Medicaid but does not currently have insurance.  If it appears that obtaining Sherryll Burger is is going to be difficult, would recommend switching to losartan 100 mg daily.  He will need close outpatient follow-up and he has follow-up arranged heart failure clinic on March 7.  He will need a basic metabolic panel at that time.  That being the case, we can plan to see in approximately 1 month or sooner if necessary.  2.  Hypertensive heart disease: Improved with medication resumption and adjustments.  Continue carvedilol and Entresto.  Adding spironolactone today.  Could consider addition of hydralazine and nitrates in the outpatient setting if blood pressure remains an issue however, I suspect compliance will be just as much an issue.  3.  Type 2 diabetes mellitus: A1c 6.1.  Continue home dose of metformin at discharge.  4.  Hyperlipidemia: Continue statin.  Lipid pending this morning.  5.  Tobacco abuse: Complete cessation advised.  6.  EtOH abuse: Drinks heavily on the weekends.  We  discussed that this is very likely contributing to his nonischemic cardiomyopathy.  Complete cessation advised.  7.  Hypokalemia: Improved today.  In the setting of switching over to oral therapy and adding spironolactone, I will reduce to 40 mEq daily.  He will need follow-up basic metabolic panel in 1 week.  Signed, Nicolasa Ducking, NP  10/22/2017, 9:08 AM    For questions or updates, please contact   Please consult [email or web address] for contact info under Cardiology/STEMI.

## 2017-10-22 NOTE — Telephone Encounter (Signed)
Scheduled 4/1

## 2017-10-22 NOTE — Care Management (Signed)
Faxed patient's [facility]  Completed the [facility] patient assistance form and faxed to clinic also.  Provided patient with Entresto 30 day coupon. Instructed to proceed at discharge to the clinic to schedule eligibility appointment and obtain meds.

## 2017-10-22 NOTE — Telephone Encounter (Signed)
Left message on machine for patient to contact the office.  Temporary phone number: [phone]

## 2017-10-22 NOTE — Telephone Encounter (Signed)
-----   Message from Creig Hines, NP sent at 10/22/2017  9:23 AM EST ----- S,  F/u in 1 month - me or MA pls?  T,  C

## 2017-10-26 NOTE — Discharge Summary (Signed)
[facility]   PATIENT NAME: Jack Davidson    MR#:  [id number]  DATE OF BIRTH:  [birth year]  DATE OF ADMISSION:  10/20/2017 ADMITTING PHYSICIAN: Arnaldo Natal, MD  DATE OF DISCHARGE: 10/22/2017  1:30 PM  PRIMARY CARE PHYSICIAN: Sherrie Mustache, MD    ADMISSION DIAGNOSIS:  Acute congestive heart failure, unspecified heart failure type (HCC) [I50.9]  DISCHARGE DIAGNOSIS:  Active Problems:   Acute on chronic systolic heart failure (HCC)   Acute on chronic congestive heart failure (HCC)   SECONDARY DIAGNOSIS:   Past Medical History:  Diagnosis Date  . Chronic systolic CHF (congestive heart failure) (HCC)    a. 05/2014 Echo: EF 45-50%; b. 12/2015 Echo: EF 45-50%, mildly dil LA; c. 02/2016 LV gram: EF 25-30%.  . Diabetes mellitus without complication (HCC)   . ETOH abuse    a. Drinks heavily on the weekends.  . Hyperlipidemia   . Hypertensive heart disease   . NICM (nonischemic cardiomyopathy) (HCC)    a. 2015 Neg MV, EF 40%;  b. 12/2015 Echo: EF 45-50% sev dil LV;  c. 02/2016 Cath: nl cors, mild PAH, CO 6.7L/m, EF 25-30%.  . Obesity   . Sleep apnea   . Tobacco use     HOSPITAL COURSE:   42 year old male with past medical history of diabetes, ischemic cardiomyopathy ejection fraction of 45%, chronic systolic CHF, essential hypertension, hyperlipidemia who presents to the hospital due to shortness of breath.  1. CHF-acute on chronic combined diastolic/systolic CHF. This was the cause of patient's worsening shortness of breath and lower extremity edema. Patient apparently was noncompliant and ran out of his medications about 2-3 weeks ago. He is also lost follow-up with cardiology as an outpatient. - pt. Was admitted and diuresed with IV Lasix and responded well to it. His volume status and shortness of breath has significantly improved since admission.   He is about 9 L (-) since admission.  - he was seen by Cardiology [facility]) and they agreed  with this management and his meds were adjusted by them.  - he has improved and now being discharged on Lasix, Aldactone, Entresto, Coreg and follow up with CHF clinic and also cardiology as outpatient.   2. Essential hypertension- he will continue carvedilol, Norvasc - BP stable.   3. Diabetes type 2 without complication- while in the hospital pt. Was on SSI and now will resume his Metformin upon discharge.   4. Hyperlipidemia- he will resume his Lovastatin. .  5. Hypokalemia-secondary to aggressive IV diuresis.  - improved and resolved w/ supplementation and can be followed as outpatient. Pt. Will cont. His PO Potassium supplements.     DISCHARGE CONDITIONS:   Stable.   CONSULTS OBTAINED:  Treatment Team:  Antonieta Iba, MD  DRUG ALLERGIES:  No Known Allergies  DISCHARGE MEDICATIONS:   Allergies as of 10/22/2017   No Known Allergies     Medication List    TAKE these medications   albuterol 108 (90 Base) MCG/ACT inhaler Commonly known as:  PROVENTIL HFA Inhale 2 puffs into the lungs every 4 (four) hours as needed for wheezing or shortness of breath.   amLODipine 5 MG tablet Commonly known as:  NORVASC Take 1 tablet (5 mg total) by mouth daily. What changed:    medication strength  how much to take   carvedilol 25 MG tablet Commonly known as:  COREG Take 1 tablet (25 mg total) by mouth 2 (two) times daily.  furosemide 40 MG tablet Commonly known as:  LASIX Take 1 tablet (40 mg total) by mouth 2 (two) times daily.   lovastatin 10 MG tablet Commonly known as:  MEVACOR Take 10 mg by mouth daily.   metFORMIN 500 MG tablet Commonly known as:  GLUCOPHAGE Take 1 tablet (500 mg total) by mouth 2 (two) times daily with a meal.   potassium chloride SA 20 MEQ tablet Commonly known as:  K-DUR,KLOR-CON Take 2 tablets (40 mEq total) by mouth daily.   sacubitril-valsartan 49-51 MG Commonly known as:  ENTRESTO Take 1 tablet by mouth 2 (two) times daily.    spironolactone 25 MG tablet Commonly known as:  ALDACTONE Take 1 tablet (25 mg total) by mouth daily.         DISCHARGE INSTRUCTIONS:   DIET:  Cardiac diet and Diabetic diet  DISCHARGE CONDITION:  Stable  ACTIVITY:  Activity as tolerated  OXYGEN:  Home Oxygen: No.   Oxygen Delivery: room air  DISCHARGE LOCATION:  home   If you experience worsening of your admission symptoms, develop shortness of breath, life threatening emergency, suicidal or homicidal thoughts you must seek medical attention immediately by calling 911 or calling your MD immediately  if symptoms less severe.  You Must read complete instructions/literature along with all the possible adverse reactions/side effects for all the Medicines you take and that have been prescribed to you. Take any new Medicines after you have completely understood and accpet all the possible adverse reactions/side effects.   Please note  You were cared for by a hospitalist during your hospital stay. If you have any questions about your discharge medications or the care you received while you were in the hospital after you are discharged, you can call the unit and asked to speak with the hospitalist on call if the hospitalist that took care of you is not available. Once you are discharged, your primary care physician will handle any further medical issues. Please note that NO REFILLS for any discharge medications will be authorized once you are discharged, as it is imperative that you return to your primary care physician (or establish a relationship with a primary care physician if you do not have one) for your aftercare needs so that they can reassess your need for medications and monitor your lab values.     Today   Shortness of breath improved. No cough, congestion and overall feels better.   VITAL SIGNS:  Blood pressure (!) 142/86, pulse 75, temperature 97.7 F (36.5 C), temperature source Oral, resp. rate 16, height 5\' 10"   (1.778 m), weight 108.6 kg (239 lb 6.4 oz), SpO2 97 %.  I/O:  No intake or output data in the 24 hours ending 10/26/17 1501  PHYSICAL EXAMINATION:  GENERAL:  42 y.o.-year-old patient lying in the bed in NAD.  EYES: Pupils equal, round, reactive to light and accommodation. No scleral icterus. Extraocular muscles intact.  HEENT: Head atraumatic, normocephalic. Oropharynx and nasopharynx clear.  NECK:  Supple, no jugular venous distention. No thyroid enlargement, no tenderness.  LUNGS: Normal breath sounds bilaterally, no wheezing, rales,rhonchi. No use of accessory muscles of respiration.  CARDIOVASCULAR: S1, S2 normal. No murmurs, rubs, or gallops.  ABDOMEN: Soft, non-tender, non-distended. Bowel sounds present. No organomegaly or mass.  EXTREMITIES: No pedal edema, cyanosis, or clubbing.  NEUROLOGIC: Cranial nerves II through XII are intact. No focal motor or sensory defecits b/l.  PSYCHIATRIC: The patient is alert and oriented x 3. Good affect.  SKIN: No obvious rash,  lesion, or ulcer.   DATA REVIEW:   CBC Recent Labs  Lab 10/20/17 0411  WBC 9.7  HGB 12.1*  HCT 35.5*  PLT 248    Chemistries  Recent Labs  Lab 10/22/17 0404  NA 139  K 3.6  CL 102  CO2 27  GLUCOSE 127*  BUN 26*  CREATININE 1.18  CALCIUM 9.3    Cardiac Enzymes Recent Labs  Lab 10/20/17 0411  TROPONINI 0.03*    Microbiology Results  No results found for this or any previous visit.  RADIOLOGY:  No results found.    Management plans discussed with the patient, family and they are in agreement.  CODE STATUS:  Code Status History    Date Active Date Inactive Code Status Order ID Comments User Context   10/20/2017 11:04 10/22/2017 16:53 Full Code [id number]  Arnaldo Natal, MD ED    TOTAL TIME TAKING CARE OF THIS PATIENT: 40 minutes.    Houston Siren M.D on 10/26/2017 at 3:01 PM  Between 7am to 6pm - Pager - [phone]  After 6pm go to [email or web address] - Social research officer, government  [facility] [place] [facility]  Office  [phone]  CC: Primary care physician; Sherrie Mustache, MD

## 2017-10-27 NOTE — Progress Notes (Deleted)
   Patient ID: Jack Davidson, male    DOB: [birth year], 42 y.o.   MRN: [id number]  HPI  Jack Davidson is a 42 y/o male with a history of  Echo report from 10/21/17 reviewed and showed an EF of 20-25% along with mild Jack. Cardiac catheterization done 03/02/16 showed normal coronary arteries with a severely reduced ejection fraction of 25-30% with moderately elevated filling pressure.  Admitted 10/20/17 due to acute on chronic heart failure. Cardiology consult was obtained. Initially given IV lasix and then transitioned to oral diuretics. Diureses ~ 9L of fluid during hospitalization. Discharged after 2 days.   He presents today for his initial visit with a chief complaint of   Review of Systems    Physical Exam  Assessment & Plan:  1: Chronic heart failure with reduced ejection fraction- - NYHA class - sees cardiology ([place]) 11/22/17 - BNP on 10/20/17 was 590.0  2: HTN- - BMP on 10/22/17 reviewed and showed sodium 139, potassium 3.6 and GFR >60  3: Diabetes- - A1c on 10/20/17 was 6.1%

## 2017-10-27 NOTE — Telephone Encounter (Signed)
Patient contacted regarding discharge from [facility] on March 1.  Patient understands to follow up with provider Eula Listen, PA-C on 11/22/17 at 3pm at [facility], [place]. Patient understands discharge instructions? yes Patient understands medications and regiment? yes Patient understands to bring all medications to this visit? yes

## 2017-10-28 ENCOUNTER — Ambulatory Visit: Payer: Self-pay | Admitting: Family

## 2017-10-28 ENCOUNTER — Telehealth: Payer: Self-pay | Admitting: Family

## 2017-10-28 NOTE — ED Provider Notes (Signed)
[facility] Emergency Department Provider Note    First MD Initiated Contact with Patient 10/20/17 0345     (approximate)  I have reviewed the triage vital signs and the nursing notes.   HISTORY  Chief Complaint Shortness of Breath    HPI Jack Davidson is a 42 y.o. male with below list of chronic medical conditions including systolic congestive heart failure diabetes hypertension presents to the emergency department with progressive dyspnea times 1 week with acute worsening tonight.  Patient states that he was unable to refill his Lasix prescription secondary to the fact that he was not able to go to his follow-up appointment.  Patient does admit to lower extremity swelling as well.  Patient denies any chest pain  Past Medical History:  Diagnosis Date  . Chronic systolic CHF (congestive heart failure) (HCC)    a. 05/2014 Echo: EF 45-50%; b. 12/2015 Echo: EF 45-50%, mildly dil LA; c. 02/2016 LV gram: EF 25-30%.  . Diabetes mellitus without complication (HCC)   . ETOH abuse    a. Drinks heavily on the weekends.  . Hyperlipidemia   . Hypertensive heart disease   . NICM (nonischemic cardiomyopathy) (HCC)    a. 2015 Neg MV, EF 40%;  b. 12/2015 Echo: EF 45-50% sev dil LV;  c. 02/2016 Cath: nl cors, mild PAH, CO 6.7L/m, EF 25-30%.  . Obesity   . Sleep apnea   . Tobacco use     Patient Active Problem List   Diagnosis Date Noted  . Acute on chronic congestive heart failure (HCC) 10/22/2017  . Acute on chronic systolic heart failure (HCC) 10/20/2017  . Chronic systolic CHF (congestive heart failure) (HCC)   . Diabetes mellitus without complication (HCC)   . ETOH abuse   . NICM (nonischemic cardiomyopathy) (HCC)   . Obesity   . Hypertensive heart disease   . Abnormal nuclear stress test   . Acute systolic CHF (congestive heart failure) (HCC) 01/14/2016  . Acute on chronic systolic CHF (congestive heart failure) (HCC) 01/14/2016  . Chronic systolic heart  failure (HCC) 16/05/9603  . Essential hypertension   . Tobacco use   . Hyperlipidemia   . Sleep apnea     Past Surgical History:  Procedure Laterality Date  . CARDIAC CATHETERIZATION N/A 03/02/2016   Procedure: Right/Left Heart Cath and Coronary Angiography;  Surgeon: Iran Ouch, MD;  Location: [facility] INVASIVE CV LAB;  Service: Cardiovascular;  Laterality: N/A;    Prior to Admission medications   Medication Sig Start Date End Date Taking? Authorizing Provider  albuterol (PROVENTIL HFA) 108 (90 BASE) MCG/ACT inhaler Inhale 2 puffs into the lungs every 4 (four) hours as needed for wheezing or shortness of breath. 08/14/15  Yes Sharman Cheek, MD  carvedilol (COREG) 25 MG tablet Take 1 tablet (25 mg total) by mouth 2 (two) times daily. 07/28/16  Yes Iran Ouch, MD  lovastatin (MEVACOR) 10 MG tablet Take 10 mg by mouth daily.    Yes [provider]  metFORMIN (GLUCOPHAGE) 500 MG tablet Take 1 tablet (500 mg total) by mouth 2 (two) times daily with a meal. 08/14/15  Yes Sharman Cheek, MD  amLODipine (NORVASC) 5 MG tablet Take 1 tablet (5 mg total) by mouth daily. 10/23/17 12/22/17  Houston Siren, MD  furosemide (LASIX) 40 MG tablet Take 1 tablet (40 mg total) by mouth 2 (two) times daily. 10/22/17 12/21/17  Houston Siren, MD  potassium chloride SA (K-DUR,KLOR-CON) 20 MEQ tablet Take 2 tablets (  40 mEq total) by mouth daily. 10/23/17 12/22/17  Houston Siren, MD  sacubitril-valsartan (ENTRESTO) 49-51 MG Take 1 tablet by mouth 2 (two) times daily. 10/22/17   Houston Siren, MD  spironolactone (ALDACTONE) 25 MG tablet Take 1 tablet (25 mg total) by mouth daily. 10/22/17 12/21/17  Houston Siren, MD    Allergies No known drug allergies  Family History  Problem Relation Age of Onset  . Diabetes Mother     Social History Social History   Tobacco Use  . Smoking status: Current Every Day Smoker    Packs/day: 0.25    Types: Cigarettes  . Smokeless tobacco: Never Used    Substance Use Topics  . Alcohol use: Yes    Alcohol/week: 0.0 oz    Comment: Drinks heavily on the weekends.  Up to a case of beer and a bottle of tequila over the course of the weekend.  . Drug use: No    Review of Systems Constitutional: No fever/chills Eyes: No visual changes. ENT: No sore throat. Cardiovascular: Denies chest pain. Respiratory: Positive for shortness of breath. Gastrointestinal: No abdominal pain.  No nausea, no vomiting.  No diarrhea.  No constipation. Genitourinary: Negative for dysuria. Musculoskeletal: Negative for neck pain.  Negative for back pain.  Positive for lower extremity swelling Integumentary: Negative for rash. Neurological: Negative for headaches, focal weakness or numbness.   ____________________________________________   PHYSICAL EXAM:  VITAL SIGNS: ED Triage Vitals  Enc Vitals Group     BP 10/20/17 0347 (!) 166/102     Pulse Rate 10/20/17 0347 (!) 101     Resp 10/20/17 0347 (!) 28     Temp 10/20/17 0347 98.7 F (37.1 C)     Temp Source 10/20/17 0347 Oral     SpO2 10/20/17 0347 (!) 85 %     Weight 10/20/17 0346 108.9 kg (240 lb)     Height 10/20/17 0346 1.778 m (5\' 10" )     Head Circumference --      Peak Flow --      Pain Score 10/20/17 2259 7     Pain Loc --      Pain Edu? --      Excl. in GC? --     Constitutional: Alert and oriented.  Apparent respiratory difficulty  eyes: Conjunctivae are normal.  Head: Atraumatic. Mouth/Throat: Mucous membranes are moist. Oropharynx non-erythematous. Neck: No stridor.   Cardiovascular: Tachycardia, regular rhythm. Good peripheral circulation. Grossly normal heart sounds. Respiratory: Tachypnea, positive accessory respiratory muscle use, bibasilar rales  gastrointestinal: Soft and nontender. No distention.  Musculoskeletal: No lower extremity tenderness nor edema. No gross deformities of extremities. Neurologic:  Normal speech and language. No gross focal neurologic deficits are  appreciated.  Skin:  Skin is warm, dry and intact. No rash noted. Psychiatric: Mood and affect are normal. Speech and behavior are normal.  ____________________________________________   LABS (all labs ordered are listed, but only abnormal results are displayed)  Labs Reviewed  BASIC METABOLIC PANEL - Abnormal; Notable for the following components:      Result Value   Glucose, Bld 133 (*)    All other components within normal limits  CBC - Abnormal; Notable for the following components:   RBC 3.80 (*)    Hemoglobin 12.1 (*)    HCT 35.5 (*)    All other components within normal limits  TROPONIN I - Abnormal; Notable for the following components:   Troponin I 0.03 (*)    All other components  within normal limits  HEMOGLOBIN A1C - Abnormal; Notable for the following components:   Hgb A1c MFr Bld 6.1 (*)    All other components within normal limits  GLUCOSE, CAPILLARY - Abnormal; Notable for the following components:   Glucose-Capillary 116 (*)    All other components within normal limits  BRAIN NATRIURETIC PEPTIDE - Abnormal; Notable for the following components:   B Natriuretic Peptide 590.0 (*)    All other components within normal limits  BASIC METABOLIC PANEL - Abnormal; Notable for the following components:   Potassium 3.1 (*)    Glucose, Bld 174 (*)    Calcium 8.8 (*)    All other components within normal limits  GLUCOSE, CAPILLARY - Abnormal; Notable for the following components:   Glucose-Capillary 151 (*)    All other components within normal limits  GLUCOSE, CAPILLARY - Abnormal; Notable for the following components:   Glucose-Capillary 156 (*)    All other components within normal limits  GLUCOSE, CAPILLARY - Abnormal; Notable for the following components:   Glucose-Capillary 121 (*)    All other components within normal limits  GLUCOSE, CAPILLARY - Abnormal; Notable for the following components:   Glucose-Capillary 144 (*)    All other components within normal  limits  GLUCOSE, CAPILLARY - Abnormal; Notable for the following components:   Glucose-Capillary 154 (*)    All other components within normal limits  BASIC METABOLIC PANEL - Abnormal; Notable for the following components:   Glucose, Bld 127 (*)    BUN 26 (*)    All other components within normal limits  LIPID PANEL - Abnormal; Notable for the following components:   Triglycerides 221 (*)    VLDL 44 (*)    All other components within normal limits  GLUCOSE, CAPILLARY - Abnormal; Notable for the following components:   Glucose-Capillary 140 (*)    All other components within normal limits  GLUCOSE, CAPILLARY - Abnormal; Notable for the following components:   Glucose-Capillary 165 (*)    All other components within normal limits  TSH   ____________________________________________  EKG  ED ECG REPORT I, [place] Blessyn [place], the attending physician, personally viewed and interpreted this ECG.   Date: 10/28/2017  EKG Time: 3:53 AM  Rate: 97  Rhythm: Normal sinus rhythm, LVH  Axis: Normal  Intervals: Normal  ST&T Change: None  ____________________________________________  RADIOLOGY I, [place] Madasyn [place], personally viewed and evaluated these images (plain radiographs) as part of my medical decision making, as well as reviewing the written report by the radiologist.  ED MD interpretation: Interstitial edema with pleural effusions   ____________________________________________   PROCEDURES  Critical Care performed: CRITICAL CARE Performed by: Darci Current   Total critical care time: 30 minutes  Critical care time was exclusive of separately billable procedures and treating other patients.  Critical care was necessary to treat or prevent imminent or life-threatening deterioration.  Critical care was time spent personally by me on the following activities: development of treatment plan with patient and/or surrogate as well as nursing, discussions with consultants,  evaluation of patient's response to treatment, examination of patient, obtaining history from patient or surrogate, ordering and performing treatments and interventions, ordering and review of laboratory studies, ordering and review of radiographic studies, pulse oximetry and re-evaluation of patient's condition.   Procedures   ____________________________________________   INITIAL IMPRESSION / ASSESSMENT AND PLAN / ED COURSE  As part of my medical decision making, I reviewed the following data within the electronic MEDICAL RECORD NUMBER  42 year old male presenting with history and physical exam consistent with pulmonary edema most likely secondary to CHF exacerbation.  Given patient's increased work of breathing and hypoxia supplemental oxygen applied.  Patient given Lasix 40 mg IV shortly after evaluation with improvement of respiratory status.  Patient discussed with Dr. Sheryle Hail for hospital admission for further evaluation and management of CHF exacerbation. ____________________________________________  FINAL CLINICAL IMPRESSION(S) / ED DIAGNOSES  Final diagnoses:  Acute congestive heart failure, unspecified heart failure type (HCC)     MEDICATIONS GIVEN DURING THIS VISIT:  Medications  furosemide (LASIX) injection 40 mg (40 mg Intravenous Given 10/20/17 0404)     ED Discharge Orders        Ordered    amLODipine (NORVASC) 5 MG tablet  Daily     10/22/17 0939    potassium chloride SA (K-DUR,KLOR-CON) 20 MEQ tablet  Daily     10/22/17 0939    furosemide (LASIX) 40 MG tablet  2 times daily     10/22/17 0939    sacubitril-valsartan (ENTRESTO) 49-51 MG  2 times daily    Comments:  Discontinue entresto 97-103mg    10/22/17 0939    spironolactone (ALDACTONE) 25 MG tablet  Daily     10/22/17 0939    Activity as tolerated - No restrictions     10/22/17 0939    Diet - low sodium heart healthy     10/22/17 0939    Diet Carb Modified     10/22/17 1610       Note:  This  document was prepared using Dragon voice recognition software and may include unintentional dictation errors.    Darci Current, MD 10/28/17 [phone]

## 2017-10-28 NOTE — Telephone Encounter (Signed)
Patient did not show for his [facility] Failure [facility] appointment on 10/28/17. Will attempt to reschedule.  

## 2017-11-22 ENCOUNTER — Ambulatory Visit: Payer: Self-pay | Admitting: Physician Assistant

## 2017-11-22 NOTE — Progress Notes (Deleted)
Cardiology Office Note Date:  11/22/2017  Patient ID:  Jack Davidson, DOB 02-Apr-1976, MRN [id number] PCP:  Sherrie Mustache, MD  Cardiologist:  Dr. Kirke Corin, MD  ***refresh   Chief Complaint: Hospital follow up  History of Present Illness: Jack Davidson is a 42 y.o. male with history of NICM felt to be likely secondary to hypertensive heart disease, uncontrolled hypertension, DM2, ETOH and tobacco abuse, obesity, OSA not on CPAP and HLD who presents for hospital follow up after recent admission to [facility] from 2/27-3/1 for acute on chronic systolic CHF due to hypertensive heart disease and noncompliance.  Prior to this admission in 09/2017 he had not been seen since 05/2016. Cardiomyopathy was initially diagnosed in 2015, at which time EF was 40%. He had a negative Myoview at that time. In 12/2015, he underwent repeat evaluation in the setting of heart failure. Echo showed an EF of 25-30%. Catheterization showed normal coronary arteries. Admitted to [facility] on 2/27 with acute on chronic systolic CHF in the setting of hypertensive heart disease with medication and dietary noncompliance. Echo on 10/21/2017 showed an EF of 20-25%, unable to exclude RWMA, Gr2DD, mild MR, modrately dilated LA, normal RVSF, unable to estimated PASP. BNP 590, troponin 0.03, A1c 6.1, LDL 89. He was diuresed ~ 9 L and restarted on medications. Discharge weight of 239 pounds. Discharge SCr 1.18, K+ 3.6. Medications: amlodipine 5 mg daily, Coreg 25 mg bid, Lasix 40 mg bid, lvastatin 10 mg daily, metformin 500 mg bid, KCl 40 mEq bid, Entresto 49/61 mg bid, spironolactone 25 mg daily.   ***   Past Medical History:  Diagnosis Date  . Chronic systolic CHF (congestive heart failure) (HCC)    a. 05/2014 Echo: EF 45-50%; b. 12/2015 Echo: EF 45-50%, mildly dil LA; c. 02/2016 LV gram: EF 25-30%.  . Diabetes mellitus without complication (HCC)   . ETOH abuse    a. Drinks heavily on the weekends.  . Hyperlipidemia   . Hypertensive heart  disease   . NICM (nonischemic cardiomyopathy) (HCC)    a. 2015 Neg MV, EF 40%;  b. 12/2015 Echo: EF 45-50% sev dil LV;  c. 02/2016 Cath: nl cors, mild PAH, CO 6.7L/m, EF 25-30%.  . Obesity   . Sleep apnea   . Tobacco use     Past Surgical History:  Procedure Laterality Date  . CARDIAC CATHETERIZATION N/A 03/02/2016   Procedure: Right/Left Heart Cath and Coronary Angiography;  Surgeon: Iran Ouch, MD;  Location: [facility] INVASIVE CV LAB;  Service: Cardiovascular;  Laterality: N/A;    No outpatient medications have been marked as taking for the 11/22/17 encounter (Appointment) with Sondra Barges, PA-C.    Allergies:   Patient has no known allergies.   Social History:  The patient  reports that he has been smoking cigarettes.  He has been smoking about 0.25 packs per day. He has never used smokeless tobacco. He reports that he drinks alcohol. He reports that he does not use drugs.   Family History:  The patient's family history includes Diabetes in his mother.  ROS:   ROS   PHYSICAL EXAM: *** VS:  There were no vitals taken for this visit. BMI: There is no height or weight on file to calculate BMI.  Physical Exam   EKG:  Was ordered and interpreted by me today. Shows ***  Recent Labs: 10/20/2017: B Natriuretic Peptide 590.0; Hemoglobin 12.1; Platelets 248; TSH 0.783 10/22/2017: BUN 26; Creatinine, Ser 1.18; Potassium 3.6; Sodium 139  10/22/2017: Cholesterol  176; HDL 43; LDL Cholesterol 89; Total CHOL/HDL Ratio 4.1; Triglycerides 221; VLDL 44   CrCl cannot be calculated (Patient's most recent lab result is older than the maximum 21 days allowed.).   Wt Readings from Last 3 Encounters:  10/22/17 239 lb 6.4 oz (108.6 kg)  06/09/16 247 lb (112 kg)  05/08/16 250 lb 4 oz (113.5 kg)     Other studies reviewed: Additional studies/records reviewed today include: summarized above  ASSESSMENT AND PLAN:  1. ***  Disposition: F/u with *** in   Current medicines are reviewed at length  with the patient today.  The patient did not have any concerns regarding medicines.  Signed, Eula Listen, PA-C 11/22/2017 11:47 AM     [facility] [place] [address] [place], [place] [address] [phone]

## 2017-11-23 ENCOUNTER — Encounter: Payer: Self-pay | Admitting: Physician Assistant

## 2018-05-24 DEATH — deceased
